# Patient Record
Sex: Female | Born: 1940 | Race: White | Hispanic: No | Marital: Married | Smoking: Never smoker
Health system: Southern US, Community
[De-identification: ages and names within clinical notes are randomized; demographics above are authoritative.]

## PROBLEM LIST (undated history)

## (undated) DIAGNOSIS — I10 Essential (primary) hypertension: Secondary | ICD-10-CM

## (undated) DIAGNOSIS — E7800 Pure hypercholesterolemia, unspecified: Secondary | ICD-10-CM

## (undated) DIAGNOSIS — E119 Type 2 diabetes mellitus without complications: Secondary | ICD-10-CM

## (undated) DIAGNOSIS — K219 Gastro-esophageal reflux disease without esophagitis: Secondary | ICD-10-CM

## (undated) DIAGNOSIS — F329 Major depressive disorder, single episode, unspecified: Secondary | ICD-10-CM

## (undated) DIAGNOSIS — F29 Unspecified psychosis not due to a substance or known physiological condition: Secondary | ICD-10-CM

## (undated) DIAGNOSIS — F32A Depression, unspecified: Secondary | ICD-10-CM

## (undated) DIAGNOSIS — F319 Bipolar disorder, unspecified: Secondary | ICD-10-CM

## (undated) DIAGNOSIS — T50912A Poisoning by multiple unspecified drugs, medicaments and biological substances, intentional self-harm, initial encounter: Secondary | ICD-10-CM

## (undated) HISTORY — PX: NO PAST SURGERIES: SHX2092

---

## 2013-09-02 ENCOUNTER — Encounter (HOSPITAL_BASED_OUTPATIENT_CLINIC_OR_DEPARTMENT_OTHER): Payer: Self-pay | Admitting: Emergency Medicine

## 2013-09-02 ENCOUNTER — Observation Stay (HOSPITAL_BASED_OUTPATIENT_CLINIC_OR_DEPARTMENT_OTHER)
Admission: EM | Admit: 2013-09-02 | Discharge: 2013-09-03 | Disposition: A | Discharge status: Other Acute Inpatient Hospital | Payer: Medicare Other | Attending: Emergency Medicine | Admitting: Emergency Medicine

## 2013-09-02 DIAGNOSIS — E119 Type 2 diabetes mellitus without complications: Secondary | ICD-10-CM | POA: Insufficient documentation

## 2013-09-02 DIAGNOSIS — I10 Essential (primary) hypertension: Secondary | ICD-10-CM | POA: Insufficient documentation

## 2013-09-02 DIAGNOSIS — R45851 Suicidal ideations: Secondary | ICD-10-CM | POA: Insufficient documentation

## 2013-09-02 DIAGNOSIS — K219 Gastro-esophageal reflux disease without esophagitis: Secondary | ICD-10-CM | POA: Insufficient documentation

## 2013-09-02 DIAGNOSIS — E7800 Pure hypercholesterolemia, unspecified: Secondary | ICD-10-CM | POA: Insufficient documentation

## 2013-09-02 DIAGNOSIS — F319 Bipolar disorder, unspecified: Principal | ICD-10-CM | POA: Insufficient documentation

## 2013-09-02 HISTORY — DX: Bipolar disorder, unspecified: F31.9

## 2013-09-02 HISTORY — DX: Major depressive disorder, single episode, unspecified: F32.9

## 2013-09-02 HISTORY — DX: Gastro-esophageal reflux disease without esophagitis: K21.9

## 2013-09-02 HISTORY — DX: Depression, unspecified: F32.A

## 2013-09-02 HISTORY — DX: Poisoning by multiple unspecified drugs, medicaments and biological substances, intentional self-harm, initial encounter: T50.912A

## 2013-09-02 HISTORY — DX: Pure hypercholesterolemia, unspecified: E78.00

## 2013-09-02 HISTORY — DX: Essential (primary) hypertension: I10

## 2013-09-02 HISTORY — DX: Unspecified psychosis not due to a substance or known physiological condition: F29

## 2013-09-02 HISTORY — DX: Type 2 diabetes mellitus without complications: E11.9

## 2013-09-02 NOTE — ED Provider Notes (Signed)
CSN: [id number]     Arrival date & time 09/02/13  2311 History   This chart was scribed for Alexis Kosar Smitty CordsK Dionne Knoop-Rasch, MD by Manuela Schwartzaylor Day, ED scribe. This patient was seen in room MH12/MH12 and the patient's care was started at 2311.  Chief Complaint  Patient presents with  . Psychiatric Evaluation   Patient is a 73 y.o. female presenting with mental health disorder. The history is provided by the patient. The history is limited by the absence of a caregiver (psychotic). No language interpreter was used.  Mental Health Problem Presenting symptoms: agitation, bizarre behavior, delusional, disorganized speech, disorganized thought process and hallucinations   Presenting symptoms: no self mutilation   Degree of incapacity (severity):  Severe Onset quality:  Unable to specify Timing:  Constant Context: not alcohol use   Worsened by:  Nothing tried Ineffective treatments:  None tried Associated symptoms: distractible   Associated symptoms: no chest pain   Risk factors: hx of mental illness    Level 5 caveat to pt's condition  [facility]   HPI Comments: Alexis Thompson is a 73 y.o. female who presents to the Emergency Department complaining of mental health problem. She rambles about psychiatric issues but is unable to organize what is going on or why she is here.  Past Medical History  Diagnosis Date  . Bipolar 1 disorder   . Diabetes mellitus without complication   . Hypertension   . High cholesterol   . GERD (gastroesophageal reflux disease)   . Depression   . Psychosis   . Suicide attempt by multiple drug overdose    History reviewed. No pertinent past surgical history. No family history on file. History  Substance Use Topics  . Smoking status: Never Smoker   . Smokeless tobacco: Not on file  . Alcohol Use: No   OB History   Grav Para Term Preterm Abortions TAB SAB Ect Mult Living                 Review of Systems  Unable to perform  ROS Constitutional: Negative for fever and chills.  Respiratory: Negative for cough and shortness of breath.   Cardiovascular: Negative for chest pain.  Gastrointestinal: Negative for nausea and vomiting.  Musculoskeletal: Negative for back pain.  Skin: Negative for rash.  Psychiatric/Behavioral: Positive for hallucinations, behavioral problems and agitation. Negative for self-injury. The patient is hyperactive.    Allergies  Review of patient's allergies indicates no known allergies.  Home Medications   Current Outpatient Rx  Name  Route  Sig  Dispense  Refill  . buPROPion (WELLBUTRIN XL) 150 MG 24 hr tablet   Oral   Take 150 mg by mouth daily.         . busPIRone (BUSPAR) 5 MG tablet   Oral   Take 5 mg by mouth 2 (two) times daily.         Marland Kitchen. FLUoxetine (PROZAC) 40 MG capsule   Oral   Take 40 mg by mouth daily.         Marland Kitchen. lamoTRIgine (LAMICTAL) 100 MG tablet   Oral   Take 100 mg by mouth daily.         Marland Kitchen. losartan (COZAAR) 100 MG tablet   Oral   Take 100 mg by mouth daily.         . metFORMIN (GLUCOPHAGE) 500 MG tablet   Oral   Take by mouth 2 (two) times daily with a meal.         .  metoprolol tartrate (LOPRESSOR) 25 MG tablet   Oral   Take 12.5 mg by mouth 2 (two) times daily.         Marland Kitchen omeprazole (PRILOSEC) 40 MG capsule   Oral   Take 40 mg by mouth daily.         . simvastatin (ZOCOR) 20 MG tablet   Oral   Take 20 mg by mouth daily.          Triage Vitals: BP 128/108  Pulse 128  Temp(Src) 98.5 F (36.9 C) (Oral)  Resp 22  Ht 5' (1.524 m)  Wt 200 lb (90.719 kg)  BMI 39.06 kg/m2  SpO2 98%  Physical Exam  Nursing note and vitals reviewed. Constitutional: She appears well-developed and well-nourished.  HENT:  Head: Normocephalic and atraumatic.  Mouth/Throat: Oropharynx is clear and moist.  Little crusting at her lips    Eyes: Conjunctivae are normal. Pupils are equal, round, and reactive to light. Right eye exhibits no  discharge. Left eye exhibits no discharge.  Neck: Normal range of motion. No tracheal deviation present.  Cardiovascular: Normal rate, regular rhythm, normal heart sounds and intact distal pulses.   No murmur heard. Pulmonary/Chest: Effort normal and breath sounds normal. No respiratory distress. She has no wheezes. She has no rales.  Abdominal: Soft. Bowel sounds are normal. She exhibits no distension. There is no tenderness. There is no rebound and no guarding.  Musculoskeletal: Normal range of motion. She exhibits no edema.  Neurological: She is alert. She displays normal reflexes.  Skin: Skin is warm and dry. She is not diaphoretic.  Psychiatric: Her speech is rapid and/or pressured and tangential. She is agitated, hyperactive and actively hallucinating. Thought content is delusional. She expresses impulsivity.  Tangential, circumstantial and hallucinating.  She is inattentive.    ED Course  Procedures (including critical care time) DIAGNOSTIC STUDIES: Oxygen Saturation is 98% on room air, normal by my interpretation.    COORDINATION OF CARE: At 1200 AM Discussed treatment plan with patient which includes blood work, UD, ETOH level. Patient agrees.   Labs Review Labs Reviewed  CBC WITH DIFFERENTIAL  COMPREHENSIVE METABOLIC PANEL  URINALYSIS, ROUTINE W REFLEX MICROSCOPIC  URINE RAPID DRUG SCREEN (HOSP PERFORMED)  ETHANOL   Imaging Review No results found.   EKG Interpretation None      MDM   Final diagnoses:  None     Date: 09/03/2013  Rate: 118  Rhythm: sinus tachycardia  QRS Axis: normal  Intervals: normal  ST/T Wave abnormalities: normal  Conduction Disutrbances:none  Narrative Interpretation:   Old EKG Reviewed: none available    Will need TTS when awakes, cleared medically   Resting comfortably at this hour    I personally performed the services described in this documentation, which was scribed in my presence. The recorded information has been  reviewed and is accurate.      Jasmine Awe, MD 09/03/13 [phone]

## 2013-09-02 NOTE — ED Notes (Addendum)
Pt noted to be speaking continually in triage - rambling and not making any sense. Do not know how pt got here at this time.  Pt not able to answer questions reliably. Pt did say "That's why I tell everybody when I get like this I'm gonna kill myself." Pts daughter came room during triage.  She brought pt to ED for threats of suicide.  Sts [facility] and has been "acting like this ever since." Daughter does not know if her meds were changed or not.

## 2013-09-03 ENCOUNTER — Encounter (HOSPITAL_BASED_OUTPATIENT_CLINIC_OR_DEPARTMENT_OTHER): Payer: Self-pay | Admitting: Emergency Medicine

## 2013-09-03 ENCOUNTER — Observation Stay (HOSPITAL_BASED_OUTPATIENT_CLINIC_OR_DEPARTMENT_OTHER): Payer: Medicare Other

## 2013-09-03 LAB — CBC WITH DIFFERENTIAL/PLATELET
Basophils Absolute: 0 10*3/uL (ref 0.0–0.1)
Basophils Relative: 0 % (ref 0–1)
EOS PCT: 0 % (ref 0–5)
Eosinophils Absolute: 0 10*3/uL (ref 0.0–0.7)
HEMATOCRIT: 39.1 % (ref 36.0–46.0)
Hemoglobin: 13 g/dL (ref 12.0–15.0)
LYMPHS ABS: 1.5 10*3/uL (ref 0.7–4.0)
LYMPHS PCT: 13 % (ref 12–46)
MCH: 31 pg (ref 26.0–34.0)
MCHC: 33.2 g/dL (ref 30.0–36.0)
MCV: 93.1 fL (ref 78.0–100.0)
Monocytes Absolute: 0.9 10*3/uL (ref 0.1–1.0)
Monocytes Relative: 8 % (ref 3–12)
NEUTROS ABS: 9.4 10*3/uL — AB (ref 1.7–7.7)
Neutrophils Relative %: 79 % — ABNORMAL HIGH (ref 43–77)
PLATELETS: 261 10*3/uL (ref 150–400)
RBC: 4.2 MIL/uL (ref 3.87–5.11)
RDW: 13.4 % (ref 11.5–15.5)
WBC: 11.9 10*3/uL — AB (ref 4.0–10.5)

## 2013-09-03 LAB — URINALYSIS, ROUTINE W REFLEX MICROSCOPIC
BILIRUBIN URINE: NEGATIVE
Glucose, UA: NEGATIVE mg/dL
Hgb urine dipstick: NEGATIVE
Ketones, ur: NEGATIVE mg/dL
Leukocytes, UA: NEGATIVE
Nitrite: NEGATIVE
Protein, ur: NEGATIVE mg/dL
SPECIFIC GRAVITY, URINE: 1.016 (ref 1.005–1.030)
UROBILINOGEN UA: 0.2 mg/dL (ref 0.0–1.0)
pH: 6 (ref 5.0–8.0)

## 2013-09-03 LAB — RAPID URINE DRUG SCREEN, HOSP PERFORMED
Amphetamines: NOT DETECTED
BARBITURATES: NOT DETECTED
Benzodiazepines: NOT DETECTED
Cocaine: NOT DETECTED
Opiates: NOT DETECTED
TETRAHYDROCANNABINOL: NOT DETECTED

## 2013-09-03 LAB — COMPREHENSIVE METABOLIC PANEL
ALK PHOS: 80 U/L (ref 39–117)
ALT: 19 U/L (ref 0–35)
AST: 24 U/L (ref 0–37)
Albumin: 4.2 g/dL (ref 3.5–5.2)
BUN: 23 mg/dL (ref 6–23)
CALCIUM: 10.7 mg/dL — AB (ref 8.4–10.5)
CO2: 24 meq/L (ref 19–32)
Chloride: 103 mEq/L (ref 96–112)
Creatinine, Ser: 1.6 mg/dL — ABNORMAL HIGH (ref 0.50–1.10)
GFR calc non Af Amer: 32 mL/min — ABNORMAL LOW (ref >90)
GFR, EST AFRICAN AMERICAN: 37 mL/min — AB (ref >90)
GLUCOSE: 158 mg/dL — AB (ref 70–99)
Potassium: 5 mEq/L (ref 3.7–5.3)
Sodium: 143 mEq/L (ref 137–147)
Total Bilirubin: <0.2 mg/dL — ABNORMAL LOW (ref 0.3–1.2)
Total Protein: 7.9 g/dL (ref 6.0–8.3)

## 2013-09-03 LAB — CBG MONITORING, ED: Glucose-Capillary: 98 mg/dL (ref 70–99)

## 2013-09-03 LAB — ETHANOL

## 2013-09-03 MED ORDER — LOSARTAN POTASSIUM 50 MG PO TABS
100.0000 mg | ORAL_TABLET | Freq: Every day | ORAL | Status: DC
  Administered 2013-09-03: 100 mg via ORAL
  Filled 2013-09-03: qty 2

## 2013-09-03 MED ORDER — HALOPERIDOL LACTATE 5 MG/ML IJ SOLN
5.0000 mg | Freq: Once | INTRAMUSCULAR | Status: AC
  Administered 2013-09-03: 5 mg via INTRAMUSCULAR

## 2013-09-03 MED ORDER — ALUM & MAG HYDROXIDE-SIMETH 200-200-20 MG/5ML PO SUSP: 30.0000 mL | ORAL | Status: DC | PRN

## 2013-09-03 MED ORDER — ACETAMINOPHEN 325 MG PO TABS: 650.0000 mg | ORAL_TABLET | ORAL | Status: DC | PRN

## 2013-09-03 MED ORDER — ONDANSETRON HCL 8 MG PO TABS: 4.0000 mg | ORAL_TABLET | Freq: Three times a day (TID) | ORAL | Status: DC | PRN

## 2013-09-03 MED ORDER — HALOPERIDOL LACTATE 5 MG/ML IJ SOLN
5.0000 mg | Freq: Once | INTRAMUSCULAR | Status: AC
  Administered 2013-09-03: 5 mg via INTRAMUSCULAR
  Filled 2013-09-03: qty 1

## 2013-09-03 MED ORDER — FLUOXETINE HCL 40 MG PO CAPS
40.0000 mg | ORAL_CAPSULE | Freq: Every day | ORAL | Status: DC
  Administered 2013-09-03: 40 mg via ORAL

## 2013-09-03 MED ORDER — LORAZEPAM 1 MG PO TABS
1.0000 mg | ORAL_TABLET | Freq: Three times a day (TID) | ORAL | Status: DC | PRN
  Administered 2013-09-03 (×2): 1 mg via ORAL
  Filled 2013-09-03: qty 1

## 2013-09-03 MED ORDER — HALOPERIDOL LACTATE 5 MG/ML IJ SOLN
INTRAMUSCULAR | Status: AC
  Filled 2013-09-03: qty 1

## 2013-09-03 MED ORDER — LORAZEPAM 1 MG PO TABS
ORAL_TABLET | ORAL | Status: AC
  Filled 2013-09-03: qty 1

## 2013-09-03 MED ORDER — LORAZEPAM 2 MG/ML IJ SOLN
1.0000 mg | Freq: Once | INTRAMUSCULAR | Status: AC
  Administered 2013-09-03: 1 mg via INTRAMUSCULAR

## 2013-09-03 MED ORDER — DARIFENACIN HYDROBROMIDE ER 15 MG PO TB24
15.0000 mg | ORAL_TABLET | Freq: Every day | ORAL | Status: DC
  Administered 2013-09-03: 15 mg via ORAL
  Filled 2013-09-03: qty 1

## 2013-09-03 MED ORDER — BUSPIRONE HCL 5 MG PO TABS
5.0000 mg | ORAL_TABLET | Freq: Two times a day (BID) | ORAL | Status: DC
  Administered 2013-09-03: 5 mg via ORAL
  Filled 2013-09-03: qty 1

## 2013-09-03 MED ORDER — METOPROLOL TARTRATE 12.5 MG HALF TABLET
12.5000 mg | ORAL_TABLET | Freq: Two times a day (BID) | ORAL | Status: DC
  Filled 2013-09-03: qty 1

## 2013-09-03 MED ORDER — SIMVASTATIN 20 MG PO TABS
20.0000 mg | ORAL_TABLET | Freq: Every day | ORAL | Status: DC
  Administered 2013-09-03: 20 mg via ORAL
  Filled 2013-09-03: qty 1

## 2013-09-03 MED ORDER — LORAZEPAM 2 MG/ML IJ SOLN
INTRAMUSCULAR | Status: AC
  Filled 2013-09-03: qty 1

## 2013-09-03 MED ORDER — LAMOTRIGINE 100 MG PO TABS
100.0000 mg | ORAL_TABLET | Freq: Every day | ORAL | Status: DC
  Administered 2013-09-03: 100 mg via ORAL
  Filled 2013-09-03: qty 1

## 2013-09-03 MED ORDER — ARIPIPRAZOLE 10 MG PO TABS
10.0000 mg | ORAL_TABLET | Freq: Every day | ORAL | Status: DC
  Administered 2013-09-03: 10 mg via ORAL
  Filled 2013-09-03: qty 1

## 2013-09-03 MED ORDER — LORAZEPAM 2 MG/ML IJ SOLN
1.0000 mg | Freq: Once | INTRAMUSCULAR | Status: AC
  Administered 2013-09-03: 1 mg via INTRAMUSCULAR
  Filled 2013-09-03: qty 1

## 2013-09-03 MED ORDER — PANTOPRAZOLE SODIUM 40 MG PO TBEC
40.0000 mg | DELAYED_RELEASE_TABLET | Freq: Every day | ORAL | Status: DC
  Administered 2013-09-03: 40 mg via ORAL

## 2013-09-03 MED ORDER — BUPROPION HCL ER (XL) 150 MG PO TB24
150.0000 mg | ORAL_TABLET | Freq: Every day | ORAL | Status: DC
  Administered 2013-09-03: 150 mg via ORAL
  Filled 2013-09-03: qty 1

## 2013-09-03 MED ORDER — HALOPERIDOL LACTATE 5 MG/ML IJ SOLN
2.0000 mg | Freq: Once | INTRAMUSCULAR | Status: AC
  Administered 2013-09-03: 2 mg via INTRAVENOUS
  Filled 2013-09-03: qty 1

## 2013-09-03 MED ORDER — METFORMIN HCL 500 MG PO TABS
500.0000 mg | ORAL_TABLET | Freq: Two times a day (BID) | ORAL | Status: DC
  Administered 2013-09-03: 500 mg via ORAL

## 2013-09-03 NOTE — ED Notes (Signed)
Ava, from TTS states she will call us back with an apt. For pt to speak with mental health professional for assessment.

## 2013-09-03 NOTE — ED Notes (Signed)
Call placed to [facility] for pt transport to [facility].

## 2013-09-03 NOTE — ED Notes (Signed)
Ava, TTS, calls back to inform this rn that pt meets inpatient placement criteria. Ava states she plans to call [facility] for placement, as there [facility]. Daughter updated on plan.

## 2013-09-03 NOTE — ED Notes (Signed)
tct jennifer with [facility], pt has been accepted via dr. Lowanda FosterBeverly jones
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
## 2013-09-03 NOTE — ED Notes (Signed)
Pt daughter calls this rn to room, states pt "is starting to hallucinate again". Pt is agitated, talking with daughter and sitter at bedside. Ativan given as ordered.

## 2013-09-03 NOTE — ED Notes (Signed)
Jennifer at [facility] notified of patients discharge, per jennifer pt is going to room 424 A

## 2013-09-03 NOTE — ED Notes (Signed)
Voluntary admission form rcvd, pt signed form, was able to verbally state reason for transfer to [facility]. Verbally stated to this RN that she wanted to go and get "medications adjusted". Sitter at bedside with patient. Pt alert and oriented to person, place, time and situation. Requested sleep medications

## 2013-09-03 NOTE — BH Assessment (Signed)
Incoming staff will [facility].

## 2013-09-03 NOTE — ED Notes (Signed)
Pt husband has brought pt home medications. Pharmacy alerted, will come to ed and assist this rn with placing them into our pyxis.

## 2013-09-03 NOTE — ED Notes (Signed)
Voluntary admission form to be faxed to [phone] per jennifer at [facility], fax number verified for 2nd time, form to be faxed again to this facility.

## 2013-09-03 NOTE — ED Notes (Signed)
[facility] not received from [facility], spoke with jennifer who is re faxing form

## 2013-09-03 NOTE — ED Notes (Signed)
t[facility]is [facility] at [facility], requesting that patient's RN call and speak with assesment RN at [facility] to complete requested information they need so final decision on acceptance can be made. Attempted to call assesment RN at [facility] [phone], no answer and no voicemail available, will attempt again

## 2013-09-03 NOTE — ED Notes (Signed)
Pt and daughter given juice and snacks per request.

## 2013-09-03 NOTE — ED Notes (Signed)
Form faxed back to jennifer at [facility], tx successful confirmation placed with patients chart. Pt provided ativan per request for nerves.

## 2013-09-03 NOTE — ED Notes (Signed)
Pt continuing to talk to staff and calling people various names.  having various scattered thought processes.  Daughter remains at bedside.  Pt appears to be in a maniac state.  Calm and then starts yelling. Unable to remain still.  Pt mumbling and talking entire time.

## 2013-09-03 NOTE — ED Notes (Signed)
rec'd call from [place]ennifer at [facility] stating they had accepted pt and would be faxing over voluntary consent form which needed to be faxed back and then bed assignment would be given. Pt accepted by Dr Low[facility] FosterBeverly Jones at [facility].

## 2013-09-03 NOTE — ED Notes (Signed)
tct [facility] at [phone], spoke with jennifer assesment nurse, informed that patient remains alert, oriented to person place and time. Pt is able to sign voluntary papers if needed

## 2013-09-03 NOTE — ED Notes (Signed)
Pt is now awake and alert, smiling and talking to daughter and sitter at bedside. Pt denies any c/o, when asked if she feels ready to talk to the doctors, states "Yes, honey, I'm ready."

## 2013-09-03 NOTE — ED Notes (Signed)
Report received, pt care assumed. Pt is sleeping, plan of care is to call [facility] when pt awakens for assessment. Pt daughter is asleep in family conference room.

## 2013-09-03 NOTE — ED Notes (Signed)
Psych triage phoned, ava states that they are still seeking placement for pt in a geriatric psych unit, and if no bed found they may seek [facility] in [facility] psych at [facility]. Pt family updated on plan of care.

## 2013-09-03 NOTE — ED Notes (Signed)
Pt and daughter in room, tts assessment in progress.

## 2013-09-03 NOTE — BH Assessment (Signed)
[facility] Assessment Progress Note        Per, nurse Diane the patient is not able to wake up to be assessed because she was given medication due to her increased mania.    At12:15am the patient was given 5mg  of Haldol and 1mg  of Ativan.   At 1:00am the patient was given 5mg  of Haldol and 1mg  of Ativan.  At 2:07am the patient was given 2mg  of Haldol.    Writer informed the ER MD (Dr. April Palumbo).  The nurse will call the assessment office once the patient is alert and able to be assessed.

## 2013-09-03 NOTE — ED Notes (Signed)
Pt home medications taken to outpatient pharmacy for verification. Two different capsules noted inside prozac bottle. Pharmacist to verfiy all home meds and call this rn when verification is complete.

## 2013-09-03 NOTE — ED Notes (Signed)
Pt discharged with [facility] transporter, ambulated to vehicle, nad, alert and oriented

## 2013-09-03 NOTE — ED Notes (Signed)
Pt's  Daughter and POA debbie returned call and stated that she would be unable to get medications until tomorrow afternoon. Informed her that medications would be left in our locked pyxis, draw 6 pkt 10 and to ask for charge RN and she can obtain them

## 2013-09-03 NOTE — ED Notes (Signed)
Pt cont sleeping with resp deep and regular. Sitter at bedside, given juice and snacks per request.

## 2013-09-03 NOTE — Progress Notes (Signed)
The patient has been referred to [facility], [facility], [facility] and [facility].

## 2013-09-03 NOTE — ED Notes (Signed)
Family at bedside. 

## 2015-04-27 ENCOUNTER — Non-Acute Institutional Stay (SKILLED_NURSING_FACILITY): Payer: Medicare Other | Admitting: Adult Health

## 2015-04-27 ENCOUNTER — Encounter: Payer: Self-pay | Admitting: Adult Health

## 2015-04-27 DIAGNOSIS — K219 Gastro-esophageal reflux disease without esophagitis: Secondary | ICD-10-CM | POA: Diagnosis not present

## 2015-04-27 DIAGNOSIS — I10 Essential (primary) hypertension: Secondary | ICD-10-CM | POA: Diagnosis not present

## 2015-04-27 DIAGNOSIS — G2571 Drug induced akathisia: Secondary | ICD-10-CM | POA: Diagnosis not present

## 2015-04-27 DIAGNOSIS — M79641 Pain in right hand: Secondary | ICD-10-CM

## 2015-04-27 DIAGNOSIS — F319 Bipolar disorder, unspecified: Secondary | ICD-10-CM | POA: Diagnosis not present

## 2015-04-27 DIAGNOSIS — N3281 Overactive bladder: Secondary | ICD-10-CM

## 2015-04-27 DIAGNOSIS — G629 Polyneuropathy, unspecified: Secondary | ICD-10-CM | POA: Insufficient documentation

## 2015-04-27 DIAGNOSIS — R296 Repeated falls: Secondary | ICD-10-CM

## 2015-04-27 DIAGNOSIS — E785 Hyperlipidemia, unspecified: Secondary | ICD-10-CM | POA: Diagnosis not present

## 2015-04-27 DIAGNOSIS — G4700 Insomnia, unspecified: Secondary | ICD-10-CM | POA: Diagnosis not present

## 2015-04-27 DIAGNOSIS — F419 Anxiety disorder, unspecified: Secondary | ICD-10-CM | POA: Insufficient documentation

## 2015-04-27 NOTE — Progress Notes (Signed)
Patient ID: Alexis Thompson, female   DOB: 10-Aug-1940, 74 y.o.   MRN: [id number]    DATE:  04/27/2015   MRN:  [id number]  BIRTHDAY: Dec 22, 1940  Facility:  Nursing Home Location:  [facility]  Nursing Home Room Number: 801-2  LEVEL OF CARE:  SNF [phone])  Contact Information    Name Relation Home Work Frisbee Spouse [phone]  [phone]   Parker,Tracy Daughter [phone]  [phone]       Chief Complaint  Patient presents with  . Hospitalization Follow-up    Recurrent falls, right hand wrist/pain, diabetes mellitus, bipolar disorder, drug induced akathisia, hypertension, anxiety, hyperlipidemia, neuropathy, insomnia, GERD and overactive bladder.    HISTORY OF PRESENT ILLNESS:  This is a 74 year old female who has been admitted to [facility] on 04/23/15 from [facility]. She lives in an ALF. She fell while getting out of her bed. She has been having recurrent falls and complicated by akathisia. She is noted to have a wrist/hand splint. Area is very tender but able to move fingers.  PAST MEDICAL HISTORY:  Past Medical History  Diagnosis Date  . Bipolar 1 disorder (HCC)   . Diabetes mellitus without complication (HCC)   . Hypertension   . High cholesterol   . GERD (gastroesophageal reflux disease)   . Depression   . Psychosis   . Suicide attempt by multiple drug overdose [facility])      CURRENT MEDICATIONS: Reviewed    Medication List       This list is accurate as of: 04/27/15  5:19 PM.  Always use your most recent med list.               amantadine 100 MG capsule  Commonly known as:  SYMMETREL  Take 100 mg by mouth 2 (two) times daily.     aspirin EC 81 MG tablet  Take 81 mg by mouth daily.     buPROPion 300 MG 24 hr tablet  Commonly known as:  WELLBUTRIN XL  Take 300 mg by mouth daily.     clorazepate 3.75 MG tablet  Commonly known as:  TRANXENE  Take 3.75 mg by mouth 2 (two) times daily.     FLUoxetine 20 MG capsule  Commonly known as:  PROZAC  Take 20 mg by mouth daily.     gabapentin 100 MG capsule  Commonly known as:  NEURONTIN  Take 100 mg by mouth at bedtime.     HYDROcodone-acetaminophen 5-325 MG tablet  Commonly known as:  NORCO/VICODIN  Take 1 tablet by mouth every 6 (six) hours as needed for moderate pain.     lamoTRIgine 200 MG tablet  Commonly known as:  LAMICTAL  Take 200 mg by mouth daily.     losartan 25 MG tablet  Commonly known as:  COZAAR  Take 25 mg by mouth daily.     Melatonin 5 MG Tabs  Take 5 mg by mouth at bedtime.     metFORMIN 500 MG 24 hr tablet  Commonly known as:  GLUCOPHAGE-XR  Take 500 mg by mouth 2 (two) times daily.     metoprolol tartrate 25 MG tablet  Commonly known as:  LOPRESSOR  Take 12.5 mg by mouth 2 (two) times daily.     nystatin 100000 UNIT/GM Powd  Apply topically 2 (two) times daily as needed.     omeprazole 40 MG capsule  Commonly known as:  PRILOSEC  Take 40 mg by mouth daily.  oxybutynin 10 MG 24 hr tablet  Commonly known as:  DITROPAN-XL  Take 10 mg by mouth every morning.     simvastatin 20 MG tablet  Commonly known as:  ZOCOR  Take 20 mg by mouth daily.     traMADol 50 MG tablet  Commonly known as:  ULTRAM  Take by mouth every 6 (six) hours as needed.           No Known Allergies   REVIEW OF SYSTEMS:  GENERAL: no change in appetite, no fatigue, no weight changes, no fever, chills or weakness EYES: Denies change in vision, dry eyes, eye pain, itching or discharge EARS: Denies change in hearing, ringing in ears, or earache NOSE: Denies nasal congestion or epistaxis MOUTH and THROAT: Denies oral discomfort, gingival pain or bleeding, pain from teeth or hoarseness   RESPIRATORY: no cough, SOB, DOE, wheezing, hemoptysis CARDIAC: no chest pain, edema or palpitations GI: no abdominal pain, diarrhea, constipation, heart burn, nausea or vomiting GU: Denies dysuria, frequency, hematuria,  incontinence, or discharge PSYCHIATRIC: Denies feeling of depression or anxiety. No report of hallucinations, insomnia, paranoia, or agitation   PHYSICAL EXAMINATION  GENERAL APPEARANCE: Well nourished. In no acute distress. Normal body habitus SKIN:  Right forearm with skin tear, no erythema  HEAD: Normal in size and contour. No evidence of trauma EYES: Lids open and close normally. No blepharitis, entropion or ectropion. PERRL. Conjunctivae are clear and sclerae are white. Lenses are without opacity EARS: Pinnae are normal. Patient hears normal voice tunes of the examiner MOUTH and THROAT: Lips are without lesions. Oral mucosa is moist and without lesions. Tongue is normal in shape, size, and color and without lesions NECK: supple, trachea midline, no neck masses, no thyroid tenderness, no thyromegaly LYMPHATICS: no LAN in the neck, no supraclavicular LAN RESPIRATORY: breathing is even & unlabored, BS CTAB CARDIAC: RRR, no murmur,no extra heart sounds, no edema GI: abdomen soft, normal BS, no masses, no tenderness, no hepatomegaly, no splenomegaly EXTREMITIES:  Able to move 4 extremities PSYCHIATRIC: Alert and oriented X 3. Affect and behavior are appropriate  LABS/RADIOLOGY: Labs reviewed: 04/21/15  WBC 6.4 hemoglobin 12.3 hematocrit 36.4 platelet 206 sodium 140 potassium 3.9 BUN 14 creatinine 1.01 glucose 106 calcium 9.2  04/20/15  sodium 143 potassium 4.2 glucose 101 BUN 17 creatinine 1.11 calcium 9.4 albumin 3.8 total protein 6.8 total bilirubin 0.4 AST 10 ALT 7 alkaline phosphatase 66 WBC 4.8 hemoglobin 13.2 hematocrit 39.8 platelet 239   ASSESSMENT/PLAN:  Recurrent falls - for rehabilitation  Right hand/wrist pain - continue tramadol 50 mg 1 tab by mouth every 6 hours when necessary and Norco 5/325 mg 1 tab by mouth every 6 hours when necessary for pain; x-ray of right hand/wrist; orthopedic [facility] Begovitch[facility], on 05/04/15 at 2  PM  Diabetes mellitus, type II - continue metformin 500 mg 1 tab by mouth twice a day; check hemoglobin A1c  Bipolar disorder -lead to do was discontinued due to side effects; continue Lamictal 200 mg 1 tab by mouth every morning, Prozac 40 mg by mouth every morning and Wellbutrin 24-hour 300 mg 1 tab by mouth every morning  Drug-induced akathisia - reportedly caused by JordanLatuda which is now discontinued; continue Amantadine Hcl 100 mg 1 capsule PO BID; neurology consult ; check CBC  Hypertension - continue Cozaar 25 mg 1 tab by mouth every morning and Toprol-XL 25 mg 1/2 tab = 12.5 mg by mouth twice a day; check BMP  Anxiety - mood is stable; continue Tranxene 3.75 mg by mouth twice a day  Hyperlipidemia - continue Zocor 20 mg 1 tab by mouth daily at bedtime  Neuropathy - continue Neurontin 100 mg 1 capsule by mouth daily at bedtime  Insomnia - continue melatonin 5 mg 1 tab by mouth daily at bedtime  GERD - continue Prilosec 40 mg by mouth every morning  Overactive bladder - continue Ditropan XL 10 mg 1 tab by mouth every morning     Goals of care:  Short-term rehabilitation    [facility], NP [facility] [phone]

## 2015-04-28 ENCOUNTER — Non-Acute Institutional Stay (SKILLED_NURSING_FACILITY): Payer: Medicare Other | Admitting: Internal Medicine

## 2015-04-28 DIAGNOSIS — M79631 Pain in right forearm: Secondary | ICD-10-CM

## 2015-04-28 DIAGNOSIS — N183 Chronic kidney disease, stage 3 unspecified: Secondary | ICD-10-CM

## 2015-04-28 DIAGNOSIS — S40021S Contusion of right upper arm, sequela: Secondary | ICD-10-CM | POA: Diagnosis not present

## 2015-04-28 DIAGNOSIS — I10 Essential (primary) hypertension: Secondary | ICD-10-CM | POA: Diagnosis not present

## 2015-04-28 DIAGNOSIS — R300 Dysuria: Secondary | ICD-10-CM | POA: Diagnosis not present

## 2015-04-28 DIAGNOSIS — R1310 Dysphagia, unspecified: Secondary | ICD-10-CM

## 2015-04-28 DIAGNOSIS — K219 Gastro-esophageal reflux disease without esophagitis: Secondary | ICD-10-CM | POA: Diagnosis not present

## 2015-04-28 DIAGNOSIS — F319 Bipolar disorder, unspecified: Secondary | ICD-10-CM

## 2015-04-28 DIAGNOSIS — F411 Generalized anxiety disorder: Secondary | ICD-10-CM

## 2015-04-28 DIAGNOSIS — E1149 Type 2 diabetes mellitus with other diabetic neurological complication: Secondary | ICD-10-CM

## 2015-04-28 DIAGNOSIS — G629 Polyneuropathy, unspecified: Secondary | ICD-10-CM

## 2015-04-28 DIAGNOSIS — N3281 Overactive bladder: Secondary | ICD-10-CM

## 2015-04-28 DIAGNOSIS — R2681 Unsteadiness on feet: Secondary | ICD-10-CM

## 2015-04-28 DIAGNOSIS — R450 Nervousness: Secondary | ICD-10-CM | POA: Diagnosis not present

## 2015-04-28 DIAGNOSIS — E785 Hyperlipidemia, unspecified: Secondary | ICD-10-CM

## 2015-04-28 DIAGNOSIS — G2571 Drug induced akathisia: Secondary | ICD-10-CM

## 2015-04-28 NOTE — Progress Notes (Signed)
Patient ID: Alexis Thompson, female   DOB: 1940-08-14, 74 y.o.   MRN: [id number]      [facility]   PCP: No PCP Per Patient  Code Status: full code  No Known Allergies  Chief Complaint  Patient presents with  . New Admit To SNF     HPI:  74 y.o. patient is here for short term rehabilitation post hospital admission from 04/21/15-04/23/15 post fall with akathisia. Fracture was ruled out. She was residing in ALF prior to this. She has PMH of HTN, GERD, OAB, bipolar disorder, HLD among others. She is seen in her room today. She is now off latuda and her tremor has improved some per patient. She complaints of pain in her right forearm. She has been very anxious per staff. No falls reported in the facility.   Review of Systems:  Constitutional: Negative for fever, chills, diaphoresis.  HENT: Negative for headache, congestion, nasal discharge. Has difficulty swallowing and occasional occipital headache, none at present.  Eyes: Negative for eye pain, blurred vision, double vision and discharge.  Respiratory: positive for dyspnea on exertion. Negative for cough and wheezing.   Cardiovascular: Negative for chest pain, palpitations, leg swelling.  Gastrointestinal: Negative for heartburn, nausea, vomiting, abdominal pain. Had bowel movement yesterday. Genitourinary: positive for dysuria and increased urinary frequency. Negative for flank pain.  Musculoskeletal: Negative for back pain, falls in the facility. Skin: Negative for itching, rash.  Neurological: Negative for dizziness, tingling, focal weakness Psychiatric/Behavioral: positive for anxiety    Past Medical History  Diagnosis Date  . Bipolar 1 disorder (HCC)   . Diabetes mellitus without complication (HCC)   . Hypertension   . High cholesterol   . GERD (gastroesophageal reflux disease)   . Depression   . Psychosis   . Suicide attempt by multiple drug overdose (HCC)    No past surgical history on  file. Social History:   reports that she has never smoked. She does not have any smokeless tobacco history on file. She reports that she does not drink alcohol or use illicit drugs.  No family history on file.  Medications:   Medication List       This list is accurate as of: 04/28/15  3:30 PM.  Always use your most recent med list.               amantadine 100 MG capsule  Commonly known as:  SYMMETREL  Take 100 mg by mouth 2 (two) times daily.     aspirin EC 81 MG tablet  Take 81 mg by mouth daily.     buPROPion 300 MG 24 hr tablet  Commonly known as:  WELLBUTRIN XL  Take 300 mg by mouth daily.     clorazepate 3.75 MG tablet  Commonly known as:  TRANXENE  Take 3.75 mg by mouth 2 (two) times daily.     FLUoxetine 20 MG capsule  Commonly known as:  PROZAC  Take 20 mg by mouth daily.     gabapentin 100 MG capsule  Commonly known as:  NEURONTIN  Take 100 mg by mouth at bedtime.     HYDROcodone-acetaminophen 5-325 MG tablet  Commonly known as:  NORCO/VICODIN  Take 1 tablet by mouth every 6 (six) hours as needed for moderate pain.     lamoTRIgine 200 MG tablet  Commonly known as:  LAMICTAL  Take 200 mg by mouth daily.     losartan 25 MG tablet  Commonly known as:  COZAAR  Take 25 mg by mouth daily.     Melatonin 5 MG Tabs  Take 5 mg by mouth at bedtime.     metFORMIN 500 MG 24 hr tablet  Commonly known as:  GLUCOPHAGE-XR  Take 500 mg by mouth 2 (two) times daily.     metoprolol tartrate 25 MG tablet  Commonly known as:  LOPRESSOR  Take 12.5 mg by mouth 2 (two) times daily.     nystatin 100000 UNIT/GM Powd  Apply topically 2 (two) times daily as needed.     omeprazole 40 MG capsule  Commonly known as:  PRILOSEC  Take 40 mg by mouth daily.     oxybutynin 10 MG 24 hr tablet  Commonly known as:  DITROPAN-XL  Take 10 mg by mouth every morning.     simvastatin 20 MG tablet  Commonly known as:  ZOCOR  Take 20 mg by mouth daily.     traMADol 50 MG  tablet  Commonly known as:  ULTRAM  Take by mouth every 6 (six) hours as needed.         Physical Exam: Filed Vitals:   04/28/15 1528  BP: 109/71  Pulse: 61  Temp: 97.9 F (36.6 C)  Resp: 18  SpO2: 96%    General- elderly female, obese, in no acute distress Head- normocephalic, atraumatic Nose- normal nasal mucosa, no maxillary or frontal sinus tenderness, no nasal discharge Throat- moist mucus membrane Eyes- PERRLA, EOMI, no pallor, no icterus, no discharge, normal conjunctiva, normal sclera Neck- no cervical lymphadenopathy Cardiovascular- normal s1,s2, no murmurs, no leg edema Respiratory- bilateral clear to auscultation, no wheeze, no rhonchi, no crackles, no use of accessory muscles Abdomen- bowel sounds present, soft, non tender Musculoskeletal- able to move all 4 extremities, generalized weakness, unsteady gait Neurological- Lolita Riegerakathasia present, alert and oriented to person and place Skin- warm and dry, easy bruising, hematoma in right forearm tender to touch, no drainage Psychiatry- normal mood and affect  Labs reviewed 04/27/15 wbc 5.2, hb 12.4, hct 38.7, plt 230, a1c 5.8, na 144, k 4.1, glu 102, bun 24, cr 1.3, gfr 42.81, ca 10   Assessment/Plan  Unsteady gait Causing Recurrent falls. Will have patient work with PT/OT as tolerated to regain strength and restore function.  Fall precautions are in place.  Akathisia Thought to be drug induced. Continue amantadine 100 mg bid for now. Pending neurology consult  Right forearm hematoma Will start her on naprosyn 250 mg bid for now x 3 days with meals to help with inflammation, will need padded dressing over the hematoma with kerlix wrap to help prevent skin breakdown. Monitor clinically. No signs of infection at present  Right hand/wrist pain  Post fall, negative for acute fracture. continue tramadol and change this to 1-2 tab q6h prn pain and discontinue norco for now. Has orthopedic appointment on  05/04/15.  Dysuria Send u/a with c/s for now, hydration encouraged, afebrile, monitor  Dysphagia Tolerating liquid food fine but has trouble occasionally with solid food. Get SLP consult  HTN BP reading on lower side of normal. Discontinue cozaar for now with low bp and impaired renal function. Continue toprol xl 12. 5 mg bid with holding parameters and check bp daily x 1 week.  BP Readings from Last 3 Encounters:  04/28/15 109/71  04/27/15 136/80  09/03/13 98/57    Diabetes mellitus 2 with neuropathy Monitor cbg, reviewed a1c. continue metformin 500 mg bid for now with statin and aspirin. Continue neurontin and fall precautions for now  ckd stage 3 D/c cozaar. Starting naproxen to help with inflammation x 5 days, check bmp in 1 week  Anxiety On clorazepate 3.75 mg bid. Change this to 7.5 mg in am and 3.75 mg in pm for now and reassess  Bipolar disorder Stable. continue Lamictal 200 mg daily, Prozac 40 mg daily and wellbutrin 300 mg daily for now and monitor.   Neuropathy Continue neurontin 100 mg qhs and monitor  OAB Continue ditropan 10 mg daily  Hyperlipidemia continue Zocor 20 mg daily  GERD continue Prilosec 40 mg daily   Goals of care: short term rehabilitation   Labs/tests ordered:  Family/ staff Communication: reviewed care plan with patient and nursing supervisor    Oneal Grout, MD  [facility] [phone] (Monday-Friday 8 am - 5 pm) [phone] (afterhours)

## 2015-05-06 ENCOUNTER — Non-Acute Institutional Stay (SKILLED_NURSING_FACILITY): Payer: Medicare Other | Admitting: Adult Health

## 2015-05-06 DIAGNOSIS — N179 Acute kidney failure, unspecified: Secondary | ICD-10-CM

## 2015-05-06 DIAGNOSIS — E1129 Type 2 diabetes mellitus with other diabetic kidney complication: Secondary | ICD-10-CM | POA: Diagnosis not present

## 2015-05-08 ENCOUNTER — Encounter: Payer: Self-pay | Admitting: Adult Health

## 2015-05-08 NOTE — Progress Notes (Signed)
Patient ID: Alexis Thompson, female   DOB: [birth year], 74 y.o.   MRN: [id number]    DATE:  05/06/15  MRN:  [id number]  BIRTHDAY: [birth year]  Facility:  Nursing Home [facility]  Nursing Home Room Number: [id number]  LEVEL OF CARE:  SNF [phone])      Contact Information    Name Relation Home Work KimbertonMobile   Hamelin,David Spouse [phone]  [phone]   Parker,Tracy Daughter [phone]  [phone]       Chief Complaint  Patient presents with  . Acute Visit    Acute renal failure    HISTORY OF PRESENT ILLNESS:  This is a 74 year old female who has been noted to have creatinine 2.25, BUN 52. She is currently taking Metformin for diabetes mellitus. She has tremors which she has since [facility].   [facility]. She lives in an ALF. She fell while getting out of her bed. She has been having recurrent falls and complicated by akathisia.    PAST MEDICAL HISTORY:  Past Medical History  Diagnosis Date  . Bipolar 1 disorder (HCC)   . Diabetes mellitus without complication (HCC)   . Hypertension   . High cholesterol   . GERD (gastroesophageal reflux disease)   . Depression   . Psychosis   . Suicide attempt by multiple drug overdose [facility](HCC)      CURRENT MEDICATIONS: Reviewed    Medication List       This list is accurate as of: 05/06/15 11:59 PM.  Always use your most recent med list.               amantadine 100 MG capsule  Commonly known as:  SYMMETREL  Take 100 mg by mouth 2 (two) times daily.     aspirin EC 81 MG tablet  Take 81 mg by mouth daily.     buPROPion 300 MG 24 hr tablet  Commonly known as:  WELLBUTRIN XL  Take 300 mg by mouth daily.     clorazepate 3.75 MG tablet  Commonly known as:  TRANXENE  Take 3.75 mg by mouth 2 (two) times daily.     FLUoxetine 20 MG capsule  Commonly known as:  PROZAC  Take 20 mg by mouth daily.     gabapentin 100 MG capsule  Commonly known as:  NEURONTIN  Take 100 mg by mouth at bedtime.     lamoTRIgine 200 MG tablet  Commonly known as:  LAMICTAL  Take 200 mg by mouth daily.     Melatonin 5 MG Tabs  Take 5 mg by mouth at bedtime.     metoprolol tartrate 25 MG tablet  Commonly known as:  LOPRESSOR  Take 12.5 mg by mouth 2 (two) times daily. Hold for SBP < 110 and HR <60     nystatin 100000 UNIT/GM Powd  Apply topically 2 (two) times daily as needed.     omeprazole 40 MG capsule  Commonly known as:  PRILOSEC  Take 40 mg by mouth daily.     oxybutynin 10 MG 24 hr tablet  Commonly known as:  DITROPAN-XL  Take 10 mg by mouth every morning.     simvastatin 20 MG tablet  Commonly known as:  ZOCOR  Take 20 mg by mouth daily.     traMADol 50 MG tablet  Commonly known as:  ULTRAM  Take by mouth every 6 (six) hours  as needed. 1-2 tabs PO Q 6 hours PRN         No Known Allergies   REVIEW OF SYSTEMS:  GENERAL: no change in appetite, no fatigue, no weight changes, no fever, chills or weakness EYES: Denies change in vision, dry eyes, eye pain, itching or discharge EARS: Denies change in hearing, ringing in ears, or earache NOSE: Denies nasal congestion or epistaxis MOUTH and THROAT: Denies oral discomfort, gingival pain or bleeding, pain from teeth or hoarseness   RESPIRATORY: no cough, SOB, DOE, wheezing, hemoptysis CARDIAC: no chest pain, edema or palpitations GI: no abdominal pain, diarrhea, constipation, heart burn, nausea or vomiting GU: Denies dysuria, frequency, hematuria, incontinence, or discharge PSYCHIATRIC: Denies feeling of depression or anxiety. No report of hallucinations, insomnia, paranoia, or agitation   PHYSICAL EXAMINATION  GENERAL APPEARANCE: Well nourished. In no acute distress. Obese SKIN:  Right forearm hematoma  HEAD: Normal in size and contour. No evidence of trauma EYES: Lids open and close normally. No blepharitis, entropion or ectropion.  PERRL. Conjunctivae are clear and sclerae are white. Lenses are without opacity EARS: Pinnae are normal. Patient hears normal voice tunes of the examiner MOUTH and THROAT: Lips are without lesions. Oral mucosa is moist and without lesions. Tongue is normal in shape, size, and color and without lesions NECK: supple, trachea midline, no neck masses, no thyroid tenderness, no thyromegaly LYMPHATICS: no LAN in the neck, no supraclavicular LAN RESPIRATORY: breathing is even & unlabored, BS CTAB CARDIAC: RRR, no murmur,no extra heart sounds, no edema GI: abdomen soft, normal BS, no masses, no tenderness, no hepatomegaly, no splenomegaly EXTREMITIES:  Able to move 4 extremities PSYCHIATRIC: Alert and oriented X 3. Affect and behavior are appropriate  LABS/RADIOLOGY: Labs reviewed: 05/05/15  sodium 140 potassium 4.6 glucose 108 BUN 52 creatinine 2.25 calcium 10.8 GFR 22.73 04/24/15  x-ray of left wrist and hand is negative for fracture 04/21/15  WBC 6.4 hemoglobin 12.3 hematocrit 36.4 platelet 206 sodium 140 potassium 3.9 BUN 14 creatinine 1.01 glucose 106 calcium 9.2  04/20/15  sodium 143 potassium 4.2 glucose 101 BUN 17 creatinine 1.11 calcium 9.4 albumin 3.8 total protein 6.8 total bilirubin 0.4 AST 10 ALT 7 alkaline phosphatase 66 WBC 4.8 hemoglobin 13.2 hematocrit 39.8 platelet 239   ASSESSMENT/PLAN:  Acute renal failure - discontinue metformin; start 1/2 NS @ 70cc/hour X 1 L; check BMP on 05/11/15; insert foley catheter and check post-void residual  Diabetes Mellitus, type 2 - discontinue Metformin; continue CBG check daily and call provider for CBG > 300     MEDINA-VARGAS,Sarya Linenberger, NP [facility] [phone]

## 2015-05-12 ENCOUNTER — Emergency Department (HOSPITAL_COMMUNITY)
Admission: EM | Admit: 2015-05-12 | Discharge: 2015-05-12 | Disposition: A | Discharge status: Home / Self Care | Payer: Medicare Other | Attending: Emergency Medicine | Admitting: Emergency Medicine

## 2015-05-12 ENCOUNTER — Encounter (HOSPITAL_COMMUNITY): Payer: Self-pay | Admitting: Emergency Medicine

## 2015-05-12 ENCOUNTER — Emergency Department (HOSPITAL_COMMUNITY): Payer: Medicare Other

## 2015-05-12 DIAGNOSIS — I10 Essential (primary) hypertension: Secondary | ICD-10-CM | POA: Insufficient documentation

## 2015-05-12 DIAGNOSIS — E119 Type 2 diabetes mellitus without complications: Secondary | ICD-10-CM | POA: Insufficient documentation

## 2015-05-12 DIAGNOSIS — Z7982 Long term (current) use of aspirin: Secondary | ICD-10-CM | POA: Diagnosis not present

## 2015-05-12 DIAGNOSIS — R441 Visual hallucinations: Secondary | ICD-10-CM | POA: Diagnosis not present

## 2015-05-12 DIAGNOSIS — Z79899 Other long term (current) drug therapy: Secondary | ICD-10-CM | POA: Insufficient documentation

## 2015-05-12 DIAGNOSIS — F319 Bipolar disorder, unspecified: Secondary | ICD-10-CM | POA: Diagnosis not present

## 2015-05-12 DIAGNOSIS — F0390 Unspecified dementia without behavioral disturbance: Secondary | ICD-10-CM | POA: Insufficient documentation

## 2015-05-12 DIAGNOSIS — K219 Gastro-esophageal reflux disease without esophagitis: Secondary | ICD-10-CM | POA: Diagnosis not present

## 2015-05-12 DIAGNOSIS — E7800 Pure hypercholesterolemia, unspecified: Secondary | ICD-10-CM | POA: Insufficient documentation

## 2015-05-12 DIAGNOSIS — J029 Acute pharyngitis, unspecified: Secondary | ICD-10-CM | POA: Diagnosis not present

## 2015-05-12 DIAGNOSIS — R509 Fever, unspecified: Secondary | ICD-10-CM | POA: Diagnosis present

## 2015-05-12 DIAGNOSIS — N390 Urinary tract infection, site not specified: Secondary | ICD-10-CM

## 2015-05-12 DIAGNOSIS — R443 Hallucinations, unspecified: Secondary | ICD-10-CM

## 2015-05-12 LAB — URINALYSIS, ROUTINE W REFLEX MICROSCOPIC
BILIRUBIN URINE: NEGATIVE
GLUCOSE, UA: NEGATIVE mg/dL
KETONES UR: 15 mg/dL — AB
NITRITE: POSITIVE — AB
PROTEIN: 100 mg/dL — AB
Specific Gravity, Urine: 1.025 (ref 1.005–1.030)
pH: 5.5 (ref 5.0–8.0)

## 2015-05-12 LAB — COMPREHENSIVE METABOLIC PANEL
ALK PHOS: 65 U/L (ref 38–126)
ALT: 19 U/L (ref 14–54)
ANION GAP: 11 (ref 5–15)
AST: 21 U/L (ref 15–41)
Albumin: 4.4 g/dL (ref 3.5–5.0)
BUN: 58 mg/dL — ABNORMAL HIGH (ref 6–20)
CHLORIDE: 106 mmol/L (ref 101–111)
CO2: 20 mmol/L — AB (ref 22–32)
CREATININE: 1.81 mg/dL — AB (ref 0.44–1.00)
Calcium: 10 mg/dL (ref 8.9–10.3)
GFR calc non Af Amer: 27 mL/min — ABNORMAL LOW (ref >60)
GFR, EST AFRICAN AMERICAN: 31 mL/min — AB (ref >60)
Glucose, Bld: 98 mg/dL (ref 65–99)
Potassium: 4.5 mmol/L (ref 3.5–5.1)
SODIUM: 137 mmol/L (ref 135–145)
Total Bilirubin: 0.9 mg/dL (ref 0.3–1.2)
Total Protein: 7.2 g/dL (ref 6.5–8.1)

## 2015-05-12 LAB — CBC
HEMATOCRIT: 39 % (ref 36.0–46.0)
HEMOGLOBIN: 12.8 g/dL (ref 12.0–15.0)
MCH: 30.5 pg (ref 26.0–34.0)
MCHC: 32.8 g/dL (ref 30.0–36.0)
MCV: 92.9 fL (ref 78.0–100.0)
Platelets: 225 10*3/uL (ref 150–400)
RBC: 4.2 MIL/uL (ref 3.87–5.11)
RDW: 14.2 % (ref 11.5–15.5)
WBC: 12.6 10*3/uL — AB (ref 4.0–10.5)

## 2015-05-12 LAB — URINE MICROSCOPIC-ADD ON

## 2015-05-12 LAB — CBG MONITORING, ED: Glucose-Capillary: 97 mg/dL (ref 65–99)

## 2015-05-12 MED ORDER — CEPHALEXIN 500 MG PO CAPS: 500.0000 mg | ORAL_CAPSULE | Freq: Two times a day (BID) | ORAL | Status: DC

## 2015-05-12 MED ORDER — DEXTROSE 5 % IV SOLN
1.0000 g | Freq: Once | INTRAVENOUS | Status: AC
  Administered 2015-05-12: 1 g via INTRAVENOUS
  Filled 2015-05-12: qty 10

## 2015-05-12 NOTE — ED Notes (Signed)
Spoke with pt daughter and she describes a rapid decline in her mothers health over the past week.  Pt had been having increasing confusion/falls and "shaking" before this too.  Pt had been to the point where the SNF had to keep her by the nurses station in a recliner in order to keep her safe from falling.

## 2015-05-12 NOTE — ED Notes (Signed)
Attempted to stick pt twice. No success. Phlebotomy to attempt.

## 2015-05-12 NOTE — Discharge Instructions (Signed)
Urinary Tract Infection Ms. Alexis Thompson, take keflex for treatment of your urine infection.  See a primary care doctor within 3 days for close follow up.  If any symptoms worsen, come back to the ED immediately.  Thank you. A urinary tract infection (UTI) can occur any place along the urinary tract. The tract includes the kidneys, ureters, bladder, and urethra. A type of germ called bacteria often causes a UTI. UTIs are often helped with antibiotic medicine.  HOME CARE   If given, take antibiotics as told by your doctor. Finish them even if you start to feel better.  Drink enough fluids to keep your pee (urine) clear or pale yellow.  Avoid tea, drinks with caffeine, and bubbly (carbonated) drinks.  Pee often. Avoid holding your pee in for a long time.  Pee before and after having sex (intercourse).  Wipe from front to back after you poop (bowel movement) if you are a woman. Use each tissue only once. GET HELP RIGHT AWAY IF:   You have back pain.  You have lower belly (abdominal) pain.  You have chills.  You feel sick to your stomach (nauseous).  You throw up (vomit).  Your burning or discomfort with peeing does not go away.  You have a fever.  Your symptoms are not better in 3 days. MAKE SURE YOU:   Understand these instructions.  Will watch your condition.  Will get help right away if you are not doing well or get worse.   This information is not intended to replace advice given to you by your health care provider. Make sure you discuss any questions you have with your health care provider.   Document Released: 11/16/2007 Document Revised: 06/20/2014 Document Reviewed: 12/29/2011 Elsevier Interactive Patient Education [employer].

## 2015-05-12 NOTE — ED Notes (Signed)
Patient transported to X-ray 

## 2015-05-12 NOTE — ED Notes (Signed)
Patient transported to CT 

## 2015-05-12 NOTE — ED Notes (Signed)
Report called to [facility], spoke with arlene and [facility]

## 2015-05-12 NOTE — ED Provider Notes (Signed)
CSN: [id number]     Arrival date & time 05/12/15  0219 History  By signing my name below, I, Phillis Haggis, attest that this documentation has been prepared under the direction and in the presence of Tomasita Crumble, MD. Electronically Signed: Phillis Haggis, ED Scribe. 05/12/2015. 3:42 AM.   Chief Complaint  Patient presents with  . Hallucinations   The history is provided by the patient. No language interpreter was used.  HPI Comments (Level 5 caveat due to dementia): Alexis Thompson is a 74 y.o. Female with a hx of Bipolar 1 Disorder, DM, depression, psychosis, and suicide attempt brought in by EMS who presents to the Emergency Department complaining of hallucinations. Per triage note, pt came from [facility] where she was being combative, rolling around on the floor, and seeing things that were not there. She would oppose to getting into a wheelchair and would get back onto the floor. Pt displays confusion which is baseline for her. She was given Ativan and Tramadol at 11 PM this evening. Pt is currently complaining of sore throat and fever. She denies cough or any other pain.   Past Medical History  Diagnosis Date  . Bipolar 1 disorder (HCC)   . Diabetes mellitus without complication (HCC)   . Hypertension   . High cholesterol   . GERD (gastroesophageal reflux disease)   . Depression   . Psychosis   . Suicide attempt by multiple drug overdose [facility])    History reviewed. No pertinent past surgical history. No family history on file. Social History  Substance Use Topics  . Smoking status: Never Smoker   . Smokeless tobacco: None  . Alcohol Use: No   OB History    No data available     Review of Systems  Unable to perform ROS: Dementia   Allergies  Codeine; Glipizide; and Tetracyclines & related  Home Medications   Prior to Admission medications   Medication Sig Start Date End Date Taking? Authorizing Provider  acetaminophen (TYLENOL) 325 MG tablet Take 650 mg by mouth  every 6 (six) hours.   Yes Historical Provider, MD  amantadine (SYMMETREL) 100 MG capsule Take 100 mg by mouth 2 (two) times daily.   Yes Historical Provider, MD  aspirin EC 81 MG tablet Take 81 mg by mouth daily.   Yes Historical Provider, MD  buPROPion (WELLBUTRIN XL) 300 MG 24 hr tablet Take 300 mg by mouth daily.   Yes Historical Provider, MD  clorazepate (TRANXENE) 3.75 MG tablet Take 3.75 mg by mouth 2 (two) times daily.   Yes Historical Provider, MD  FLUoxetine (PROZAC) 20 MG capsule Take 20 mg by mouth daily.   Yes Historical Provider, MD  gabapentin (NEURONTIN) 100 MG capsule Take 100 mg by mouth at bedtime.   Yes Historical Provider, MD  lamoTRIgine (LAMICTAL) 200 MG tablet Take 200 mg by mouth daily.   Yes Historical Provider, MD  LORazepam (ATIVAN) 2 MG/ML concentrated solution Take 2 mg by mouth once.   Yes Historical Provider, MD  losartan (COZAAR) 25 MG tablet Take 25 mg by mouth daily.   Yes Historical Provider, MD  Melatonin 5 MG TABS Take 5 mg by mouth at bedtime.   Yes Historical Provider, MD  metoprolol tartrate (LOPRESSOR) 25 MG tablet Take 12.5 mg by mouth 2 (two) times daily. Hold for SBP < 110 and HR <60   Yes Historical Provider, MD  nystatin (MYCOSTATIN/NYSTOP) 100000 UNIT/GM POWD Apply topically 2 (two) times daily as needed.   Yes  Historical Provider, MD  omeprazole (PRILOSEC) 40 MG capsule Take 40 mg by mouth daily.   Yes Historical Provider, MD  oxybutynin (DITROPAN-XL) 10 MG 24 hr tablet Take 10 mg by mouth every morning.   Yes Historical Provider, MD  simvastatin (ZOCOR) 20 MG tablet Take 20 mg by mouth daily.   Yes Historical Provider, MD  traMADol (ULTRAM) 50 MG tablet Take by mouth every 6 (six) hours as needed. 1-2 tabs PO Q 6 hours PRN   Yes Historical Provider, MD   BP 179/138 mmHg  Pulse 101  Resp 18  SpO2 93% Physical Exam  Constitutional: She appears well-developed and well-nourished. No distress.  HENT:  Head: Normocephalic and atraumatic.  Nose:  Nose normal.  Mouth/Throat: Oropharynx is clear and moist. No oropharyngeal exudate.  Eyes: Conjunctivae and EOM are normal. Pupils are equal, round, and reactive to light. No scleral icterus.  Neck: Normal range of motion. Neck supple. No JVD present. No tracheal deviation present. No thyromegaly present.  Cardiovascular: Normal rate, regular rhythm and normal heart sounds.  Exam reveals no gallop and no friction rub.   No murmur heard. Pulmonary/Chest: Effort normal and breath sounds normal. No respiratory distress. She has no wheezes. She exhibits no tenderness.  Abdominal: Soft. Bowel sounds are normal. She exhibits no distension and no mass. There is no tenderness. There is no rebound and no guarding.  Musculoskeletal: Normal range of motion. She exhibits no edema or tenderness.  Lymphadenopathy:    She has no cervical adenopathy.  Neurological: She is alert. No cranial nerve deficit. She exhibits normal muscle tone.  Frequent bilateral UE tremors; pt speaking to herself incomprehensibly  Skin: Skin is warm and dry. No rash noted. No erythema. No pallor.  Nursing note and vitals reviewed.   ED Course  Procedures (including critical care time) DIAGNOSTIC STUDIES: Oxygen Saturation is 93% on RA, low by my interpretation.    COORDINATION OF CARE: 3:22 AM-labs  Labs Review Labs Reviewed  COMPREHENSIVE METABOLIC PANEL - Abnormal; Notable for the following:    CO2 20 (*)    BUN 58 (*)    Creatinine, Ser 1.81 (*)    GFR calc non Af Amer 27 (*)    GFR calc Af Amer 31 (*)    All other components within normal limits  CBC - Abnormal; Notable for the following:    WBC 12.6 (*)    All other components within normal limits  URINALYSIS, ROUTINE W REFLEX MICROSCOPIC (NOT AT [facility]) - Abnormal; Notable for the following:    APPearance TURBID (*)    Hgb urine dipstick LARGE (*)    Ketones, ur 15 (*)    Protein, ur 100 (*)    Nitrite POSITIVE (*)    Leukocytes, UA LARGE (*)    All other  components within normal limits  URINE MICROSCOPIC-ADD ON - Abnormal; Notable for the following:    Squamous Epithelial / LPF 0-5 (*)    Bacteria, UA MANY (*)    All other components within normal limits  CBG MONITORING, ED    Imaging Review Dg Chest 1 View  05/12/2015  CLINICAL DATA:  Fever and sore throat. EXAM: CHEST 1 VIEW COMPARISON:  09/03/2013 FINDINGS: A single AP portable view of the chest demonstrates no focal airspace consolidation or alveolar edema. The lungs are grossly clear. There is no large effusion or pneumothorax. Cardiac and mediastinal contours appear unremarkable. IMPRESSION: No active disease. Electronically Signed   By: Rosey Bath.D.  On: 05/12/2015 03:55   Ct Head Wo Contrast  05/12/2015  CLINICAL DATA:  Visual hallucinations.  No known trauma. EXAM: CT HEAD WITHOUT CONTRAST TECHNIQUE: Contiguous axial images were obtained from the base of the skull through the vertex without intravenous contrast. COMPARISON:  02/09/2015 FINDINGS: There is no intracranial hemorrhage, mass or evidence of acute infarction. There is hemispheric white matter hypodensity consistent with chronic disease. There is moderate generalized atrophy. No interval change is evident. No bone abnormalities evident. The visible paranasal sinuses are clear. IMPRESSION: No acute intracranial findings. There is severe chronic white matter disease and moderate generalized atrophy, without significant interval change. Electronically Signed   By: Ellery Plunkaniel R Mitchell M.D.   On: 05/12/2015 05:10   I have personally reviewed and evaluated these images and lab results as part of my medical decision-making.   EKG Interpretation None      MDM   Final diagnoses:  None   Patient presents to the ED for hallucinations.  She is unable to give me any further history.  Will obtain blood work and infection work up for evaluation.  She is moving all 4 extremities in the ED but will not follow my commands, which  is her baseline per EMS.  Urinalysis reveals an infection, likely cause of patient's symptoms. Laboratory studies are otherwise unremarkable, chest x-ray and CT scan are normal. Patient was given ceftriaxone will be discharged with Keflex to take for treatment. She appears well and in no acute distress, tachycardia has resolved, without signs were within her normal limits and she is safe for discharge.  I personally performed the services described in this documentation, which was scribed in my presence. The recorded information has been reviewed and is accurate.     Tomasita CrumbleAdeleke Kaytelynn Scripter, MD 05/12/15 [phone]

## 2015-05-12 NOTE — ED Notes (Signed)
Per EMS [facility]  Pt has been there for the past month for strengthening  Staff states pt has been rolling around on the floor and being combative and seeing things that were not there  Attempted to put pt in wheelchair but pt would get back in the floor  Pt is appropriate with EMS  Displays some confusion which is normal for the pt  Pt is a DNR  Pt has a psych history of bipolar disorder  Pt was given ativan around 2300  Pt has taken her nightly medications  Pt was given tramadol this evening as well

## 2015-05-12 NOTE — ED Notes (Signed)
Pt daughter called and tells me that pt has been having rapid decline in past week.  Increasing agitation, confusion, falls, tremor. Pt has been having hallucinations with this also.

## 2015-05-12 NOTE — ED Notes (Signed)
Pt has an indwelling catheter that was not draining because leg bag was too tight. Once loosened it began to drain into a clean urine bag.  Therefore In and out cath was not done but urine sample was collected.

## 2015-05-27 ENCOUNTER — Encounter (HOSPITAL_COMMUNITY): Payer: Self-pay | Admitting: Emergency Medicine

## 2015-05-27 ENCOUNTER — Inpatient Hospital Stay (HOSPITAL_COMMUNITY): Payer: Medicare Other

## 2015-05-27 ENCOUNTER — Inpatient Hospital Stay (HOSPITAL_COMMUNITY)
Admission: EM | Admit: 2015-05-27 | Discharge: 2015-06-03 | DRG: 871 | Disposition: A | Discharge status: Skilled Nursing Facility | Payer: Medicare Other | Attending: Family Medicine | Admitting: Family Medicine

## 2015-05-27 ENCOUNTER — Emergency Department (HOSPITAL_COMMUNITY): Payer: Medicare Other

## 2015-05-27 DIAGNOSIS — B965 Pseudomonas (aeruginosa) (mallei) (pseudomallei) as the cause of diseases classified elsewhere: Secondary | ICD-10-CM | POA: Diagnosis present

## 2015-05-27 DIAGNOSIS — Z66 Do not resuscitate: Secondary | ICD-10-CM | POA: Diagnosis present

## 2015-05-27 DIAGNOSIS — I6523 Occlusion and stenosis of bilateral carotid arteries: Secondary | ICD-10-CM | POA: Diagnosis present

## 2015-05-27 DIAGNOSIS — F0390 Unspecified dementia without behavioral disturbance: Secondary | ICD-10-CM | POA: Diagnosis present

## 2015-05-27 DIAGNOSIS — Z915 Personal history of self-harm: Secondary | ICD-10-CM

## 2015-05-27 DIAGNOSIS — G9340 Encephalopathy, unspecified: Secondary | ICD-10-CM | POA: Diagnosis present

## 2015-05-27 DIAGNOSIS — R259 Unspecified abnormal involuntary movements: Secondary | ICD-10-CM | POA: Diagnosis not present

## 2015-05-27 DIAGNOSIS — Z7982 Long term (current) use of aspirin: Secondary | ICD-10-CM | POA: Diagnosis not present

## 2015-05-27 DIAGNOSIS — F319 Bipolar disorder, unspecified: Secondary | ICD-10-CM | POA: Diagnosis present

## 2015-05-27 DIAGNOSIS — R32 Unspecified urinary incontinence: Secondary | ICD-10-CM | POA: Diagnosis present

## 2015-05-27 DIAGNOSIS — A419 Sepsis, unspecified organism: Secondary | ICD-10-CM | POA: Diagnosis present

## 2015-05-27 DIAGNOSIS — E870 Hyperosmolality and hypernatremia: Secondary | ICD-10-CM | POA: Diagnosis present

## 2015-05-27 DIAGNOSIS — E872 Acidosis: Secondary | ICD-10-CM | POA: Diagnosis present

## 2015-05-27 DIAGNOSIS — Z79899 Other long term (current) drug therapy: Secondary | ICD-10-CM | POA: Diagnosis not present

## 2015-05-27 DIAGNOSIS — Z22322 Carrier or suspected carrier of Methicillin resistant Staphylococcus aureus: Secondary | ICD-10-CM | POA: Diagnosis not present

## 2015-05-27 DIAGNOSIS — N179 Acute kidney failure, unspecified: Secondary | ICD-10-CM | POA: Insufficient documentation

## 2015-05-27 DIAGNOSIS — K1120 Sialoadenitis, unspecified: Secondary | ICD-10-CM | POA: Diagnosis present

## 2015-05-27 DIAGNOSIS — T7840XA Allergy, unspecified, initial encounter: Secondary | ICD-10-CM | POA: Diagnosis not present

## 2015-05-27 DIAGNOSIS — K219 Gastro-esophageal reflux disease without esophagitis: Secondary | ICD-10-CM | POA: Diagnosis present

## 2015-05-27 DIAGNOSIS — I10 Essential (primary) hypertension: Secondary | ICD-10-CM | POA: Diagnosis present

## 2015-05-27 DIAGNOSIS — E119 Type 2 diabetes mellitus without complications: Secondary | ICD-10-CM | POA: Diagnosis present

## 2015-05-27 DIAGNOSIS — R450 Nervousness: Secondary | ICD-10-CM | POA: Diagnosis not present

## 2015-05-27 DIAGNOSIS — L03221 Cellulitis of neck: Secondary | ICD-10-CM | POA: Diagnosis present

## 2015-05-27 DIAGNOSIS — R251 Tremor, unspecified: Secondary | ICD-10-CM | POA: Diagnosis present

## 2015-05-27 DIAGNOSIS — E860 Dehydration: Secondary | ICD-10-CM | POA: Diagnosis present

## 2015-05-27 DIAGNOSIS — R443 Hallucinations, unspecified: Secondary | ICD-10-CM

## 2015-05-27 DIAGNOSIS — N390 Urinary tract infection, site not specified: Secondary | ICD-10-CM | POA: Diagnosis present

## 2015-05-27 DIAGNOSIS — I639 Cerebral infarction, unspecified: Secondary | ICD-10-CM | POA: Diagnosis not present

## 2015-05-27 DIAGNOSIS — R410 Disorientation, unspecified: Secondary | ICD-10-CM | POA: Diagnosis present

## 2015-05-27 DIAGNOSIS — B952 Enterococcus as the cause of diseases classified elsewhere: Secondary | ICD-10-CM | POA: Diagnosis present

## 2015-05-27 DIAGNOSIS — E785 Hyperlipidemia, unspecified: Secondary | ICD-10-CM | POA: Diagnosis present

## 2015-05-27 DIAGNOSIS — G2401 Drug induced subacute dyskinesia: Secondary | ICD-10-CM | POA: Diagnosis present

## 2015-05-27 DIAGNOSIS — I509 Heart failure, unspecified: Secondary | ICD-10-CM | POA: Diagnosis not present

## 2015-05-27 DIAGNOSIS — K210 Gastro-esophageal reflux disease with esophagitis: Secondary | ICD-10-CM

## 2015-05-27 LAB — CBC WITH DIFFERENTIAL/PLATELET
BASOS PCT: 0 %
Basophils Absolute: 0 10*3/uL (ref 0.0–0.1)
EOS PCT: 0 %
Eosinophils Absolute: 0 10*3/uL (ref 0.0–0.7)
HEMATOCRIT: 40.5 % (ref 36.0–46.0)
Hemoglobin: 13.5 g/dL (ref 12.0–15.0)
LYMPHS ABS: 5.5 10*3/uL — AB (ref 0.7–4.0)
Lymphocytes Relative: 17 %
MCH: 31.1 pg (ref 26.0–34.0)
MCHC: 33.3 g/dL (ref 30.0–36.0)
MCV: 93.3 fL (ref 78.0–100.0)
MONO ABS: 1.9 10*3/uL — AB (ref 0.1–1.0)
MONOS PCT: 6 %
NEUTROS ABS: 24.9 10*3/uL — AB (ref 1.7–7.7)
Neutrophils Relative %: 77 %
PLATELETS: 245 10*3/uL (ref 150–400)
RBC: 4.34 MIL/uL (ref 3.87–5.11)
RDW: 15.7 % — AB (ref 11.5–15.5)
WBC: 32.3 10*3/uL — AB (ref 4.0–10.5)

## 2015-05-27 LAB — URINE MICROSCOPIC-ADD ON

## 2015-05-27 LAB — I-STAT CG4 LACTIC ACID, ED
Lactic Acid, Venous: 3.7 mmol/L (ref 0.5–2.0)
Lactic Acid, Venous: 1.5 mmol/L (ref 0.5–2.0)

## 2015-05-27 LAB — COMPREHENSIVE METABOLIC PANEL
ALT: 19 U/L (ref 14–54)
AST: 20 U/L (ref 15–41)
Albumin: 4.2 g/dL (ref 3.5–5.0)
Alkaline Phosphatase: 75 U/L (ref 38–126)
Anion gap: 20 — ABNORMAL HIGH (ref 5–15)
BILIRUBIN TOTAL: 1.1 mg/dL (ref 0.3–1.2)
BUN: 40 mg/dL — AB (ref 6–20)
CALCIUM: 10.2 mg/dL (ref 8.9–10.3)
CO2: 15 mmol/L — ABNORMAL LOW (ref 22–32)
Chloride: 107 mmol/L (ref 101–111)
Creatinine, Ser: 1.46 mg/dL — ABNORMAL HIGH (ref 0.44–1.00)
GFR calc non Af Amer: 35 mL/min — ABNORMAL LOW (ref >60)
GFR, EST AFRICAN AMERICAN: 41 mL/min — AB (ref >60)
Glucose, Bld: 192 mg/dL — ABNORMAL HIGH (ref 65–99)
POTASSIUM: 3.8 mmol/L (ref 3.5–5.1)
Sodium: 142 mmol/L (ref 135–145)
TOTAL PROTEIN: 7.6 g/dL (ref 6.5–8.1)

## 2015-05-27 LAB — URINALYSIS, ROUTINE W REFLEX MICROSCOPIC
GLUCOSE, UA: NEGATIVE mg/dL
KETONES UR: 15 mg/dL — AB
Nitrite: NEGATIVE
PH: 5.5 (ref 5.0–8.0)
Protein, ur: 30 mg/dL — AB
Specific Gravity, Urine: 1.022 (ref 1.005–1.030)

## 2015-05-27 LAB — TROPONIN I
TROPONIN I: 0.11 ng/mL — AB (ref <0.031)
TROPONIN I: 0.1 ng/mL — AB (ref <0.031)
Troponin I: 0.08 ng/mL — ABNORMAL HIGH (ref <0.031)

## 2015-05-27 LAB — PROTIME-INR
INR: 1.35 (ref 0.00–1.49)
Prothrombin Time: 16.7 seconds — ABNORMAL HIGH (ref 11.6–15.2)

## 2015-05-27 LAB — MRSA PCR SCREENING: MRSA by PCR: POSITIVE — AB

## 2015-05-27 MED ORDER — PIPERACILLIN-TAZOBACTAM 3.375 G IVPB
3.3750 g | Freq: Once | INTRAVENOUS | Status: AC
  Administered 2015-05-27: 3.375 g via INTRAVENOUS
  Filled 2015-05-27: qty 50

## 2015-05-27 MED ORDER — METOPROLOL TARTRATE 1 MG/ML IV SOLN
5.0000 mg | INTRAVENOUS | Status: DC | PRN
  Administered 2015-05-28 (×2): 5 mg via INTRAVENOUS
  Filled 2015-05-27 (×2): qty 5

## 2015-05-27 MED ORDER — HALOPERIDOL LACTATE 5 MG/ML IJ SOLN
1.0000 mg | Freq: Four times a day (QID) | INTRAMUSCULAR | Status: DC | PRN
  Administered 2015-05-27: 1 mg via INTRAVENOUS
  Filled 2015-05-27: qty 1

## 2015-05-27 MED ORDER — LAMOTRIGINE 200 MG PO TABS
200.0000 mg | ORAL_TABLET | Freq: Every day | ORAL | Status: DC
  Administered 2015-05-27 – 2015-06-03 (×6): 200 mg via ORAL
  Filled 2015-05-27 (×8): qty 1

## 2015-05-27 MED ORDER — CHLORHEXIDINE GLUCONATE CLOTH 2 % EX PADS
6.0000 | MEDICATED_PAD | Freq: Every day | CUTANEOUS | Status: AC
  Administered 2015-05-28 – 2015-06-01 (×5): 6 via TOPICAL

## 2015-05-27 MED ORDER — GUAIFENESIN-DM 100-10 MG/5ML PO SYRP: 5.0000 mL | ORAL_SOLUTION | ORAL | Status: DC | PRN

## 2015-05-27 MED ORDER — FLUOXETINE HCL 20 MG PO CAPS
20.0000 mg | ORAL_CAPSULE | Freq: Every day | ORAL | Status: DC
  Administered 2015-05-27 – 2015-06-03 (×6): 20 mg via ORAL
  Filled 2015-05-27 (×10): qty 1

## 2015-05-27 MED ORDER — CETYLPYRIDINIUM CHLORIDE 0.05 % MT LIQD
7.0000 mL | Freq: Two times a day (BID) | OROMUCOSAL | Status: DC
  Administered 2015-05-28 – 2015-06-03 (×14): 7 mL via OROMUCOSAL

## 2015-05-27 MED ORDER — VANCOMYCIN HCL IN DEXTROSE 1-5 GM/200ML-% IV SOLN
1000.0000 mg | INTRAVENOUS | Status: DC
  Administered 2015-05-28 – 2015-05-29 (×2): 1000 mg via INTRAVENOUS
  Filled 2015-05-27 (×2): qty 200

## 2015-05-27 MED ORDER — HYDRALAZINE HCL 20 MG/ML IJ SOLN
10.0000 mg | INTRAMUSCULAR | Status: DC | PRN
  Administered 2015-05-29: 10 mg via INTRAVENOUS
  Filled 2015-05-27: qty 1

## 2015-05-27 MED ORDER — METOPROLOL TARTRATE 12.5 MG HALF TABLET
12.5000 mg | ORAL_TABLET | Freq: Two times a day (BID) | ORAL | Status: DC
  Administered 2015-05-27 (×2): 12.5 mg via ORAL
  Filled 2015-05-27 (×3): qty 1

## 2015-05-27 MED ORDER — LORAZEPAM 2 MG/ML IJ SOLN
1.0000 mg | Freq: Once | INTRAMUSCULAR | Status: AC
  Administered 2015-05-27: 1 mg via INTRAVENOUS
  Filled 2015-05-27: qty 1

## 2015-05-27 MED ORDER — ONDANSETRON HCL 4 MG/2ML IJ SOLN: 4.0000 mg | Freq: Four times a day (QID) | INTRAMUSCULAR | Status: DC | PRN

## 2015-05-27 MED ORDER — SODIUM CHLORIDE 0.9 % IV SOLN
INTRAVENOUS | Status: AC
  Administered 2015-05-28: 01:00:00 via INTRAVENOUS

## 2015-05-27 MED ORDER — HEPARIN SODIUM (PORCINE) 5000 UNIT/ML IJ SOLN
5000.0000 [IU] | Freq: Three times a day (TID) | INTRAMUSCULAR | Status: DC
  Administered 2015-05-27 – 2015-06-03 (×21): 5000 [IU] via SUBCUTANEOUS
  Filled 2015-05-27 (×21): qty 1

## 2015-05-27 MED ORDER — ONDANSETRON HCL 4 MG PO TABS: 4.0000 mg | ORAL_TABLET | Freq: Four times a day (QID) | ORAL | Status: DC | PRN

## 2015-05-27 MED ORDER — AMANTADINE HCL 100 MG PO CAPS
100.0000 mg | ORAL_CAPSULE | Freq: Two times a day (BID) | ORAL | Status: DC
  Administered 2015-05-27 – 2015-06-03 (×9): 100 mg via ORAL
  Filled 2015-05-27 (×17): qty 1

## 2015-05-27 MED ORDER — ACETAMINOPHEN 650 MG RE SUPP
650.0000 mg | Freq: Once | RECTAL | Status: AC
  Administered 2015-05-27: 650 mg via RECTAL
  Filled 2015-05-27: qty 1

## 2015-05-27 MED ORDER — BUPROPION HCL ER (XL) 150 MG PO TB24
150.0000 mg | ORAL_TABLET | Freq: Every day | ORAL | Status: DC
Start: 2015-05-27 — End: 2015-06-03
  Administered 2015-05-27 – 2015-06-03 (×7): 150 mg via ORAL
  Filled 2015-05-27 (×10): qty 1

## 2015-05-27 MED ORDER — LIP MEDEX EX OINT
TOPICAL_OINTMENT | CUTANEOUS | Status: AC
  Administered 2015-05-27: 1
  Filled 2015-05-27: qty 7

## 2015-05-27 MED ORDER — SODIUM CHLORIDE 0.9 % IJ SOLN
3.0000 mL | Freq: Two times a day (BID) | INTRAMUSCULAR | Status: DC
  Administered 2015-05-27 – 2015-06-03 (×6): 3 mL via INTRAVENOUS

## 2015-05-27 MED ORDER — BENZTROPINE MESYLATE 1 MG/ML IJ SOLN
1.0000 mg | Freq: Two times a day (BID) | INTRAMUSCULAR | Status: AC
  Administered 2015-05-27 (×2): 1 mg via INTRAVENOUS
  Filled 2015-05-27: qty 1
  Filled 2015-05-27: qty 2

## 2015-05-27 MED ORDER — VANCOMYCIN HCL 10 G IV SOLR
1500.0000 mg | Freq: Once | INTRAVENOUS | Status: AC
  Administered 2015-05-27: 1500 mg via INTRAVENOUS
  Filled 2015-05-27: qty 1500

## 2015-05-27 MED ORDER — CHLORHEXIDINE GLUCONATE 0.12 % MT SOLN
15.0000 mL | Freq: Two times a day (BID) | OROMUCOSAL | Status: DC
  Administered 2015-05-27 – 2015-06-03 (×15): 15 mL via OROMUCOSAL
  Filled 2015-05-27 (×13): qty 15

## 2015-05-27 MED ORDER — ASPIRIN 300 MG RE SUPP
150.0000 mg | Freq: Every day | RECTAL | Status: DC
  Administered 2015-05-28: 150 mg via RECTAL
  Filled 2015-05-27 (×5): qty 1

## 2015-05-27 MED ORDER — MUPIROCIN 2 % EX OINT
1.0000 "application " | TOPICAL_OINTMENT | Freq: Two times a day (BID) | CUTANEOUS | Status: AC
  Administered 2015-05-27 – 2015-06-01 (×10): 1 via NASAL
  Filled 2015-05-27: qty 22

## 2015-05-27 MED ORDER — MORPHINE SULFATE (PF) 2 MG/ML IV SOLN
1.0000 mg | INTRAVENOUS | Status: DC | PRN
  Administered 2015-05-29: 1 mg via INTRAVENOUS
  Filled 2015-05-27 (×2): qty 1

## 2015-05-27 MED ORDER — SODIUM CHLORIDE 0.9 % IV BOLUS (SEPSIS)
2000.0000 mL | Freq: Once | INTRAVENOUS | Status: AC
  Administered 2015-05-27: 08:00:00 via INTRAVENOUS

## 2015-05-27 MED ORDER — PIPERACILLIN-TAZOBACTAM 3.375 G IVPB
3.3750 g | Freq: Three times a day (TID) | INTRAVENOUS | Status: DC
  Administered 2015-05-27 – 2015-06-01 (×14): 3.375 g via INTRAVENOUS
  Filled 2015-05-27 (×12): qty 50

## 2015-05-27 MED ORDER — GABAPENTIN 300 MG PO CAPS
300.0000 mg | ORAL_CAPSULE | Freq: Three times a day (TID) | ORAL | Status: DC
  Administered 2015-05-27 (×2): 300 mg via ORAL
  Filled 2015-05-27 (×3): qty 1

## 2015-05-27 MED ORDER — ASPIRIN EC 81 MG PO TBEC
81.0000 mg | DELAYED_RELEASE_TABLET | Freq: Every day | ORAL | Status: DC
  Administered 2015-05-27 – 2015-06-03 (×6): 81 mg via ORAL
  Filled 2015-05-27 (×9): qty 1

## 2015-05-27 NOTE — Consult Note (Signed)
Neurology Consultation Reason for Consult: Altered mental status and tremors Referring Physician: Burney GauzeSingh, P  CC: Altered mental stats  History is obtained from:daughter  HPI: Alexis Thompson is a 74 y.o. female with a history of bipolar disorder and history of mental status changes with mild infections(uti) who presents with psychosis, increased falls, and "tremors" for six weeks.    She was admitted to a geri-psych unit for hallucinations in March 2015. She then had improvement, though they recommended evaluation for dementia even at that time. She did well until September 2015 at which time she developed a urinary tract infection which caused significant altered mental status and prolonged hospitalization needing rehabilitation. She was then doing well in assisted living until about 6. weeks ago. She began having falls, and was [facility]. There, she was diagnosed with drug-induced akathisia.  She had been steadily improving up until Thursday at which point she she was able to be interactive, but still not back to baseline. On Saturday, began having recurrent hallucinations and therefore was brought into the emergency room.  Of note for quite some time before the abnormal movements began, the patient complained of a internal sensation of feeling the need to move. She does have a history of restless leg syndrome.   ROS: A 14 point ROS was performed and is negative except as noted in the HPI.   Past Medical History  Diagnosis Date  . Bipolar 1 disorder (HCC)   . Diabetes mellitus without complication (HCC)   . Hypertension   . High cholesterol   . GERD (gastroesophageal reflux disease)   . Depression   . Psychosis   . Suicide attempt by multiple drug overdose (HCC)      FHx: Restless leg syndrome   Social History:  reports that she has never smoked. She does not have any smokeless tobacco history on file. She reports that she does not drink alcohol or use illicit  drugs.   Exam: Current vital signs: BP 180/76 mmHg  Pulse 126  Temp(Src) 97.9 F (36.6 C) (Oral)  Resp 13  Ht 5\' 1"  (1.549 m)  Wt 88.6 kg (195 lb 5.2 oz)  BMI 36.93 kg/m2  SpO2 90% Vital signs in last 24 hours: Temp:  [97.9 F (36.6 C)-100.5 F (38.1 C)] 97.9 F (36.6 C) (12/14 1600) Pulse Rate:  [67-126] 126 (12/14 1532) Resp:  [13-28] 13 (12/14 1532) BP: (109-195)/(31-157) 180/76 mmHg (12/14 1532) SpO2:  [90 %-100 %] 90 % (12/14 1532) Weight:  [88.6 kg (195 lb 5.2 oz)-97.07 kg (214 lb)] 88.6 kg (195 lb 5.2 oz) (12/14 1400)  Physical Exam  Constitutional: Appears well-developed and well-nourished.  Psych: ? respondign to unseen stimuli, but not clear.  Eyes: conjugate HENT: Swollen, tender parotid glands.  Head: Normocephalic.  Cardiovascular: Normal rate  Respiratory: Effort normal  GI: Soft.  There is no tenderness.  Skin: WDI  Neuro: Mental Status: Patient is awake, alert, gives location as [facility].  Cranial Nerves: II: Visual Fields are full. Pupils are equal, round, and reactive to light.   III,IV, VI: EOMI without ptosis or diploplia.  V: Facial sensation is symmetric to temperature VII: Facial movement is symmetric.  VIII: hearing is intact to voice X: Uvula elevates symmetrically XI: Shoulder shrug is symmetric. XII: tongue is midline without atrophy or fasciculations.  Motor: Tone is normal. Bulk is normal. 5/5 strength was present in all four extremities.  Sensory: Sensation is symmetric to light touch and temperature in the arms  and legs. Cerebellar: FNF and HKS are intact bilaterally  I have reviewed labs in epic and the results pertinent to this consultation are: ua - mod leuks Lactate 3.7  I have reviewed the images obtained:CT head - negative  Impression: 74 year old female with what appears to be akathisia, though severe chorea would be in the differential as well.  My suspicion remains that this is drug related given the timing and  the fact that neuroleptics often cause akathisa. The timing, however is unusual given that it is continuing despite having been off the medication for a month.    Recommendations: 1) gabapentin 300 mg 3 times a day 2) Cogentin 1 mg 4 times a day 3) treatment of underlying inflammatory processes   Ritta Slot, MD [facility] [phone]  If 7pm- 7am, please page neurology on call as listed in AMION.

## 2015-05-27 NOTE — ED Notes (Signed)
She remains agitated, but less so than before receiving IV ativan.  Dr. Thedore MinsSingh examines her as I write this in the presence of her husband.  She is in no distress.

## 2015-05-27 NOTE — ED Notes (Signed)
She becomes very agitated indeed; pulling at her lines/etc.  Dr. Thedore MinsSingh notified and orders rec'd.  We gently apply bilat. Soft wrist restraints; and I have just given her IV haldol. All this performed in the presence of her husband, to whom we explain our procedures and our desire to deep pt. Safe.

## 2015-05-27 NOTE — Progress Notes (Addendum)
ANTIBIOTIC CONSULT NOTE - INITIAL  Pharmacy Consult for Zosyn Indication: rule out sepsis  Allergies  Allergen Reactions  . Clindamycin/Lincomycin Anaphylaxis  . Codeine Other (See Comments)    Listed on MAR  . Glipizide Other (See Comments)    Listed on MAR  . Tetracyclines & Related Other (See Comments)    [facility] on [facility]    Patient Measurements: Weight: 214 lb (97.07 kg)  Vital Signs: Temp: 99.9 F (37.7 C) (12/14 1047) Temp Source: Rectal (12/14 1047) BP: 177/65 mmHg (12/14 1123) Pulse Rate: 71 (12/14 1123) Intake/Output from previous day:   Intake/Output from this shift:    Labs:  Recent Labs  05/27/15 0650  WBC 32.3*  HGB 13.5  PLT 245  CREATININE 1.46*   Estimated Creatinine Clearance: 36.9 mL/min (by C-G formula based on Cr of 1.46). No results for input(s): VANCOTROUGH, VANCOPEAK, VANCORANDOM, GENTTROUGH, GENTPEAK, GENTRANDOM, TOBRATROUGH, TOBRAPEAK, TOBRARND, AMIKACINPEAK, AMIKACINTROU, AMIKACIN in the last 72 hours.   Microbiology: Recent Results (from the past 720 hour(s))  Urine culture     Status: None (Preliminary result)   Collection Time: 05/27/15 10:47 AM  Result Value Ref Range Status   Specimen Description URINE, RANDOM  Final   Special Requests   Final    NONE Clindamycin x 2 days [facility]    Culture PENDING  Incomplete   Report Status PENDING  Incomplete    Medical History: Past Medical History  Diagnosis Date  . Bipolar 1 disorder (HCC)   . Diabetes mellitus without complication (HCC)   . Hypertension   . High cholesterol   . GERD (gastroesophageal reflux disease)   . Depression   . Psychosis   . Suicide attempt by multiple drug overdose (HCC)     Medications:  Anti-infectives    Start     Dose/Rate Route Frequency Ordered Stop   05/27/15 0845  piperacillin-tazobactam (ZOSYN) IVPB 3.375 g     3.375 g 12.5 mL/hr over 240 Minutes Intravenous  Once 05/27/15 0844 05/27/15 1000     Assessment: 74  y.o. female NH resident with PMH of dementia, bipolar HTN, tremor 2/2 antipsychotics presents 05/27/2015 with possible anaphylactic reaction to Clindamycin.  Placed on Clindamycin yesterday for nonpurulent cellulitis of the neck but developed allergic reaction shortly thereafter.  Febrile with elevated WBC and LA on presentation, now improved.  Pharmacy consulted to dose Zosyn for sepsis.  Also noted w/ UTI.  PTA clindamycin >> 12/14 12/14 >> Zosyn >>    **Cx drawn after Clindamycin** 12/14 blood: IP 12/14 urine: IP  Tmax: 100.5 WBC: elevated Renal: SCr elevated; CrCl 37 CG, 39 N LA now resolved   Goal of Therapy:  Eradication of infection Appropriate antibiotic dosing for indication and renal function  Plan:  Day 2 antibiotics Zosyn 3.375 g IV every 8 hrs by 4-hr infusion  Follow clinical course, renal function, culture results as available  Follow for de-escalation of antibiotics and LOT.  Plan to narrow as able following determination of etiology (Cellulitis vs Lemierre syndrome; also UTI)   Bernadene Personrew Brande Uncapher, PharmD, BCPS Pager: [phone] 05/27/2015, 12:01 PM

## 2015-05-27 NOTE — ED Provider Notes (Addendum)
CSN: [id number]     Arrival date & time 05/27/15  [id number] History   First MD Initiated Contact with Patient 05/27/15 [phone]     Chief Complaint  Patient presents with  . Allergic Reaction      HPI  Patient presents for evaluation of shortness of breath.  Patient is receiving [facility]. Developed a rash this morning and complained of dyspnea. EMS was called. Treated for possible anaphylaxis with Benadryl, nebulized albuterol, solu-Medrol, and epinephrine 1 mg doses 2 en route. Also IV Zantac 150.  Patient states that her neck starting hurting on Friday. Initial report was that she was admitted at [facility] over the weekend for infection. In speaking with family, and the care facility, as well as review of records from [facility] this is indeed not true. She was empirically started on clindamycin for a "neck infection yesterday after developing throat pain and neck pain over the weekend.  Has has  been getting the clindamycin since that time. However, nursing notes indicate that this is only the second dose of clindamycin at her SNF.  Past Medical History  Diagnosis Date  . Bipolar 1 disorder (HCC)   . Diabetes mellitus without complication (HCC)   . Hypertension   . High cholesterol   . GERD (gastroesophageal reflux disease)   . Depression   . Psychosis   . Suicide attempt by multiple drug overdose [facility](HCC)    History reviewed. No pertinent past surgical history. No family history on file. Social History  Substance Use Topics  . Smoking status: Never Smoker   . Smokeless tobacco: None  . Alcohol Use: No   OB History    No data available     Review of Systems  Constitutional: Negative for fever, chills, diaphoresis, appetite change and fatigue.  HENT: Negative for congestion, drooling, facial swelling, mouth sores, sore throat and trouble swallowing.   Eyes: Negative for visual disturbance.  Respiratory: Positive for shortness of breath. Negative  for cough, chest tightness and wheezing.   Cardiovascular: Negative for chest pain.  Gastrointestinal: Negative for nausea, vomiting, abdominal pain, diarrhea and abdominal distention.  Endocrine: Negative for polydipsia, polyphagia and polyuria.  Genitourinary: Negative for dysuria, frequency and hematuria.  Musculoskeletal: Positive for neck pain. Negative for gait problem.  Skin: Positive for rash. Negative for color change and pallor.  Neurological: Positive for tremors. Negative for dizziness, syncope, light-headedness and headaches.  Hematological: Does not bruise/bleed easily.  Psychiatric/Behavioral: Negative for behavioral problems and confusion.      Allergies  Clindamycin/lincomycin; Codeine; Glipizide; and Tetracyclines & related  Home Medications   Prior to Admission medications   Medication Sig Start Date End Date Taking? Authorizing Provider  acetaminophen (TYLENOL) 325 MG tablet Take 650 mg by mouth every 6 (six) hours as needed for headache.    Yes Historical Provider, MD  amantadine (SYMMETREL) 100 MG capsule Take 100 mg by mouth 2 (two) times daily.   Yes Historical Provider, MD  aspirin EC 81 MG tablet Take 81 mg by mouth daily.   Yes Historical Provider, MD  buPROPion (WELLBUTRIN XL) 300 MG 24 hr tablet Take 300 mg by mouth daily.   Yes Historical Provider, MD  clorazepate (TRANXENE) 7.5 MG tablet Take 7.5 mg by mouth daily.   Yes Historical Provider, MD  FLUoxetine (PROZAC) 20 MG capsule Take 20 mg by mouth daily.   Yes Historical Provider, MD  gabapentin (NEURONTIN) 100 MG capsule Take 100 mg by mouth at  bedtime.   Yes Historical Provider, MD  lamoTRIgine (LAMICTAL) 200 MG tablet Take 200 mg by mouth daily.   Yes Historical Provider, MD  Melatonin 5 MG TABS Take 5 mg by mouth at bedtime.   Yes Historical Provider, MD  metoprolol tartrate (LOPRESSOR) 25 MG tablet Take 12.5 mg by mouth 2 (two) times daily. Hold for SBP < 110 and HR <60   Yes Historical Provider, MD    nystatin (MYCOSTATIN/NYSTOP) 100000 UNIT/GM POWD Apply topically 2 (two) times daily as needed (stomach folds).    Yes Historical Provider, MD  omeprazole (PRILOSEC) 40 MG capsule Take 40 mg by mouth daily.   Yes Historical Provider, MD  oxybutynin (DITROPAN-XL) 10 MG 24 hr tablet Take 10 mg by mouth every morning.   Yes Historical Provider, MD  saccharomyces boulardii (FLORASTOR) 250 MG capsule Take 250 mg by mouth 2 (two) times daily.   Yes Historical Provider, MD  simvastatin (ZOCOR) 20 MG tablet Take 20 mg by mouth daily.   Yes Historical Provider, MD  traMADol (ULTRAM) 50 MG tablet Take 50-100 mg by mouth every 6 (six) hours as needed for moderate pain.    Yes Historical Provider, MD  clindamycin (CLEOCIN) 300 MG capsule Take 300 mg by mouth every 6 (six) hours.    Historical Provider, MD   BP 146/99 mmHg  Pulse 80  Temp(Src) 99.9 F (37.7 C) (Rectal)  Resp 16  Wt 214 lb (97.07 kg)  SpO2 97% Physical Exam  Constitutional: She is oriented to person, place, and time. She appears well-developed and well-nourished. She appears distressed.  HENT:  Head: Normocephalic.  Mouth/Throat: Mucous membranes are dry. No uvula swelling. No posterior oropharyngeal edema.    No drooling or stridor. Uvula midline. Normal-appearing tonsils. No asymmetry or swelling. Floor the mouth soft.  Eyes: Conjunctivae are normal. Pupils are equal, round, and reactive to light. No scleral icterus.  Neck: Normal range of motion. Neck supple. No thyromegaly present.    Tenderness and soft tissue swelling and induration to the left lateral neck. None to the midline neck. Not drooling.  Cardiovascular: Normal rate and regular rhythm.  Exam reveals no gallop and no friction rub.   No murmur heard. Pulmonary/Chest: Effort normal and breath sounds normal. No respiratory distress. She has no wheezes. She has no rales.  Clear bilateral breath sounds. No increased work of breathing. No wheezing.  Abdominal: Soft.  Bowel sounds are normal. She exhibits no distension. There is no tenderness. There is no rebound.  Musculoskeletal: Normal range of motion.  Neurological: She is alert and oriented to person, place, and time. She displays tremor.  Skin: Skin is warm and dry. No rash noted.  Diffuse erythema of the skin, no organized rash, no urticaria.  Psychiatric: She has a normal mood and affect. Her behavior is normal.    ED Course  Procedures (including critical care time) Labs Review Labs Reviewed  CBC WITH DIFFERENTIAL/PLATELET - Abnormal; Notable for the following:    WBC 32.3 (*)    RDW 15.7 (*)    Neutro Abs 24.9 (*)    Lymphs Abs 5.5 (*)    Monocytes Absolute 1.9 (*)    All other components within normal limits  COMPREHENSIVE METABOLIC PANEL - Abnormal; Notable for the following:    CO2 15 (*)    Glucose, Bld 192 (*)    BUN 40 (*)    Creatinine, Ser 1.46 (*)    GFR calc non Af Amer 35 (*)  GFR calc Af Amer 41 (*)    Anion gap 20 (*)    All other components within normal limits  URINALYSIS, ROUTINE W REFLEX MICROSCOPIC (NOT AT [facility]) - Abnormal; Notable for the following:    APPearance CLOUDY (*)    Hgb urine dipstick MODERATE (*)    Bilirubin Urine MODERATE (*)    Ketones, ur 15 (*)    Protein, ur 30 (*)    Leukocytes, UA MODERATE (*)    All other components within normal limits  TROPONIN I - Abnormal; Notable for the following:    Troponin I 0.11 (*)    All other components within normal limits  URINE MICROSCOPIC-ADD ON - Abnormal; Notable for the following:    Squamous Epithelial / LPF 0-5 (*)    Bacteria, UA MANY (*)    Casts HYALINE CASTS (*)    All other components within normal limits  I-STAT CG4 LACTIC ACID, ED - Abnormal; Notable for the following:    Lactic Acid, Venous 3.70 (*)    All other components within normal limits  CULTURE, BLOOD (ROUTINE X 2)  CULTURE, BLOOD (ROUTINE X 2)  URINE CULTURE  I-STAT CG4 LACTIC ACID, ED  I-STAT CG4 LACTIC ACID, ED  I-STAT  CG4 LACTIC ACID, ED    Imaging Review Ct Soft Tissue Neck Wo Contrast  05/27/2015  CLINICAL DATA:  74 year old female with anaphylaxis and neck swelling EXAM: CT NECK WITHOUT CONTRAST TECHNIQUE: Multidetector CT imaging of the neck was performed following the standard protocol without intravenous contrast. COMPARISON:  Prior CT scan of the head 05/12/2015 prior CT scan of the cervical spine 02/24/2014 FINDINGS: Visualized intracranial contents are unremarkable save for stable cerebral atrophy. The globes and orbits are symmetric and within normal limits bilaterally. Mild reticulation of the subcutaneous fat overlying both parotid glands and extending down to the level of the mandible. Additionally, there is hypo density within the slightly enlarged tongue suggesting the presence of edema. Normal aeration of mastoid air cells and visualized paranasal sinuses. The nasopharyngeal and oro pharyngeal airways remain patent. The epiglottis is well-defined and not particularly edematous. Perhaps mild edema of the aryepiglottic folds. The subglottic airway is patent. The visualized lung apices are within normal limits. 2 vessel aortic arch. The brachiocephalic and left common carotid artery share a common origin. The great vessels are highly tortuous. The thyroid gland and thoracic inlet are unremarkable. No acute osseous abnormality. Atherosclerotic calcifications are present in the right and left carotid bifurcations. IMPRESSION: 1. No significant narrowing of the airway. 2. Reticulation of the subcutaneous fat of the bilateral cheeks extending from the temporal fossa to the mandible consistent with edema. 3. Suggestion of mild edema of the tongue and aryepiglottic folds. 4. Calcifications in the bilateral carotid artery bifurcations. Consider further evaluation with dedicated carotid ultrasound to assess for underlying stenosis. Electronically Signed   By: Malachy Moan M.D.   On: 05/27/2015 09:03   Dg Chest  Port 1 View  05/27/2015  CLINICAL DATA:  Shortness of breath today. Hypertension. Nonsmoker. EXAM: PORTABLE CHEST 1 VIEW COMPARISON:  05/12/2015; 09/03/2013; 01/22/2013 FINDINGS: Grossly unchanged cardiac silhouette and mediastinal contours given reduced lung volumes. Atherosclerotic plaque within the thoracic aorta. Worsening bilateral infrahilar heterogeneous slightly nodular interstitial opacities. No pleural effusion or pneumothorax. No evidence of edema. No acute osseus abnormalities. IMPRESSION: 1. No definite acute cardiopulmonary disease on this hypoventilated AP portable examination. 2. Decreased lung volumes with worsening bilateral infrahilar opacities favored to represent atelectasis. Electronically Signed   By:  Simonne Come M.D.   On: 05/27/2015 07:27   I have personally reviewed and evaluated these images and lab results as part of my medical decision-making.   EKG Interpretation   Date/Time:  Wednesday May 27 2015 07:48:05 EST Ventricular Rate:  77 PR Interval:    QRS Duration: 98 QT Interval:  420 QTC Calculation: 475 R Axis:   28 Text Interpretation:  Sinus rhythm Artifact Confirmed by Fayrene Fearing  MD, Koben Daman  [phone]) on 05/27/2015 8:38:19 AM      MDM   Final diagnoses:  Cellulitis, neck  Allergic reaction, initial encounter    CT neck does not show localizing abscess. Does show soft tissue swelling/edema. The story I'm able to piece together, the patient's swelling and neck discomfort preceded her allergic reaction today. Rest, I think this is very likely cellulitis. No airway compromise radiographically, or clinically. She is not stridorous. She is able lie supine. She is maintaining saturations. She is now 4 hours in the emergency room status post initial treatment for her allergic reaction and his show no sign of progression of allergic reaction with hypotension, airway swelling, stridor, or recurrent rash.  Does have initial lactic acidosis at 3.7. Given fluids 2 L IV.  Recheck 1.50. Urine suggests sign of infection and culture pending. Given IV Zosyn.    Rolland Porter, MD 05/27/15 1114  Rolland Porter, MD 05/27/15 [phone]

## 2015-05-27 NOTE — ED Notes (Signed)
She is still mildly agitated and irritable, but much better than before Haldol.

## 2015-05-27 NOTE — Consult Note (Signed)
Reason for Consult:face swelling Referring Physician:ER  Alexis Thompson is an 74 y.o. female.  HPI: hx of reaction to clindamycin. The family states there was not swelling of the face until after the reaction. She is not having any airway issues. She is very dry of the mouth and that is chronic. She never had any stridor. No nasal obstruction.   Past Medical History  Diagnosis Date  . Bipolar 1 disorder ([place])   . Diabetes mellitus without complication ([place])   . Hypertension   . High cholesterol   . GERD (gastroesophageal reflux disease)   . Depression   . Psychosis   . Suicide attempt by multiple drug overdose [facility])     History reviewed. No pertinent past surgical history.  No family history on file.  Social History:  reports that she has never smoked. She does not have any smokeless tobacco history on file. She reports that she does not drink alcohol or use illicit drugs.  Allergies:  Allergies  Allergen Reactions  . Clindamycin/Lincomycin Anaphylaxis  . Codeine Other (See Comments)    Listed on MAR  . Glipizide Other (See Comments)    Listed on MAR  . Tetracyclines & Related Other (See Comments)    Listed on MAR    Medications: I have reviewed the patient's current medications.  Results for orders placed or performed during the hospital encounter of 05/27/15 (from the past 48 hour(s))  CBC with Differential     Status: Abnormal   Collection Time: 05/27/15  6:50 AM  Result Value Ref Range   WBC 32.3 (H) 4.0 - 10.5 K/uL   RBC 4.34 3.87 - 5.11 MIL/uL   Hemoglobin 13.5 12.0 - 15.0 g/dL   HCT 40.5 36.0 - 46.0 %   MCV 93.3 78.0 - 100.0 fL   MCH 31.1 26.0 - 34.0 pg   MCHC 33.3 30.0 - 36.0 g/dL   RDW 15.7 (H) 11.5 - 15.5 %   Platelets 245 150 - 400 K/uL   Neutrophils Relative % 77 %   Lymphocytes Relative 17 %   Monocytes Relative 6 %   Eosinophils Relative 0 %   Basophils Relative 0 %   Neutro Abs 24.9 (H) 1.7 - 7.7 K/uL   Lymphs Abs 5.5 (H) 0.7 - 4.0 K/uL   Monocytes Absolute 1.9 (H) 0.1 - 1.0 K/uL   Eosinophils Absolute 0.0 0.0 - 0.7 K/uL   Basophils Absolute 0.0 0.0 - 0.1 K/uL   Smear Review LARGE PLATELETS PRESENT   Comprehensive metabolic panel     Status: Abnormal   Collection Time: 05/27/15  6:50 AM  Result Value Ref Range   Sodium 142 135 - 145 mmol/L   Potassium 3.8 3.5 - 5.1 mmol/L   Chloride 107 101 - 111 mmol/L   CO2 15 (L) 22 - 32 mmol/L   Glucose, Bld 192 (H) 65 - 99 mg/dL   BUN 40 (H) 6 - 20 mg/dL   Creatinine, Ser 1.46 (H) 0.44 - 1.00 mg/dL   Calcium 10.2 8.9 - 10.3 mg/dL   Total Protein 7.6 6.5 - 8.1 g/dL   Albumin 4.2 3.5 - 5.0 g/dL   AST 20 15 - 41 U/L   ALT 19 14 - 54 U/L   Alkaline Phosphatase 75 38 - 126 U/L   Total Bilirubin 1.1 0.3 - 1.2 mg/dL   GFR calc non Af Amer 35 (L) >60 mL/min   GFR calc Af Amer 41 (L) >60 mL/min    Comment: (NOTE) The eGFR  has been calculated using the CKD EPI equation. This calculation has not been validated in all clinical situations. eGFR's persistently <60 mL/min signify possible Chronic Kidney Disease.    Anion gap 20 (H) 5 - 15  Troponin I     Status: Abnormal   Collection Time: 05/27/15  6:50 AM  Result Value Ref Range   Troponin I 0.11 (H) <0.031 ng/mL    Comment:        PERSISTENTLY INCREASED TROPONIN VALUES IN THE RANGE OF 0.04-0.49 ng/mL CAN BE SEEN IN:       -UNSTABLE ANGINA       -CONGESTIVE HEART FAILURE       -MYOCARDITIS       -CHEST TRAUMA       -ARRYHTHMIAS       -LATE PRESENTING MYOCARDIAL INFARCTION       -COPD   CLINICAL FOLLOW-UP RECOMMENDED.   I-Stat CG4 Lactic Acid, ED     Status: Abnormal   Collection Time: 05/27/15  7:02 AM  Result Value Ref Range   Lactic Acid, Venous 3.70 (HH) 0.5 - 2.0 mmol/L   Comment NOTIFIED PHYSICIAN   Urinalysis, Routine w reflex microscopic (not at [facility])     Status: Abnormal   Collection Time: 05/27/15  7:15 AM  Result Value Ref Range   Color, Urine YELLOW YELLOW   APPearance CLOUDY (A) CLEAR   Specific Gravity,  Urine 1.022 1.005 - 1.030   pH 5.5 5.0 - 8.0   Glucose, UA NEGATIVE NEGATIVE mg/dL   Hgb urine dipstick MODERATE (A) NEGATIVE   Bilirubin Urine MODERATE (A) NEGATIVE   Ketones, ur 15 (A) NEGATIVE mg/dL   Protein, ur 30 (A) NEGATIVE mg/dL   Nitrite NEGATIVE NEGATIVE   Leukocytes, UA MODERATE (A) NEGATIVE  Urine microscopic-add on     Status: Abnormal   Collection Time: 05/27/15  7:15 AM  Result Value Ref Range   Squamous Epithelial / LPF 0-5 (A) NONE SEEN   WBC, UA 6-30 0 - 5 WBC/hpf   RBC / HPF 6-30 0 - 5 RBC/hpf   Bacteria, UA MANY (A) NONE SEEN   Casts HYALINE CASTS (A) NEGATIVE   Urine-Other YEAST PRESENT   Urine culture     Status: None (Preliminary result)   Collection Time: 05/27/15 10:47 AM  Result Value Ref Range   Specimen Description URINE, RANDOM    Special Requests      NONE Clindamycin x 2 days Performed at [facility]    Culture PENDING    Report Status PENDING   I-Stat CG4 Lactic Acid, ED  (not at  [facility])     Status: None   Collection Time: 05/27/15 11:06 AM  Result Value Ref Range   Lactic Acid, Venous 1.50 0.5 - 2.0 mmol/L    Ct Soft Tissue Neck Wo Contrast  05/27/2015  CLINICAL DATA:  74 year old female with anaphylaxis and neck swelling EXAM: CT NECK WITHOUT CONTRAST TECHNIQUE: Multidetector CT imaging of the neck was performed following the standard protocol without intravenous contrast. COMPARISON:  Prior CT scan of the head 05/12/2015 prior CT scan of the cervical spine 02/24/2014 FINDINGS: Visualized intracranial contents are unremarkable save for stable cerebral atrophy. The globes and orbits are symmetric and within normal limits bilaterally. Mild reticulation of the subcutaneous fat overlying both parotid glands and extending down to the level of the mandible. Additionally, there is hypo density within the slightly enlarged tongue suggesting the presence of edema. Normal aeration of mastoid air cells and visualized  paranasal sinuses. The  nasopharyngeal and oro pharyngeal airways remain patent. The epiglottis is well-defined and not particularly edematous. Perhaps mild edema of the aryepiglottic folds. The subglottic airway is patent. The visualized lung apices are within normal limits. 2 vessel aortic arch. The brachiocephalic and left common carotid artery share a common origin. The great vessels are highly tortuous. The thyroid gland and thoracic inlet are unremarkable. No acute osseous abnormality. Atherosclerotic calcifications are present in the right and left carotid bifurcations. IMPRESSION: 1. No significant narrowing of the airway. 2. Reticulation of the subcutaneous fat of the bilateral cheeks extending from the temporal fossa to the mandible consistent with edema. 3. Suggestion of mild edema of the tongue and aryepiglottic folds. 4. Calcifications in the bilateral carotid artery bifurcations. Consider further evaluation with dedicated carotid ultrasound to assess for underlying stenosis. Electronically Signed   By: Jacqulynn Cadet M.D.   On: 05/27/2015 09:03   Dg Chest Port 1 View  05/27/2015  CLINICAL DATA:  Shortness of breath today. Hypertension. Nonsmoker. EXAM: PORTABLE CHEST 1 VIEW COMPARISON:  05/12/2015; 09/03/2013; 01/22/2013 FINDINGS: Grossly unchanged cardiac silhouette and mediastinal contours given reduced lung volumes. Atherosclerotic plaque within the thoracic aorta. Worsening bilateral infrahilar heterogeneous slightly nodular interstitial opacities. No pleural effusion or pneumothorax. No evidence of edema. No acute osseus abnormalities. IMPRESSION: 1. No definite acute cardiopulmonary disease on this hypoventilated AP portable examination. 2. Decreased lung volumes with worsening bilateral infrahilar opacities favored to represent atelectasis. Electronically Signed   By: Sandi Mariscal M.D.   On: 05/27/2015 07:27    ROS Blood pressure 177/65, pulse 71, temperature 99.9 F (37.7 C), temperature source Rectal,  resp. rate 18, weight 97.07 kg (214 lb), SpO2 99 %. Physical Exam  Constitutional: She appears well-developed and well-nourished.  HENT:  She has swelling over the left and right face with right greater. The parotids are swollen bilaterally and right is more firm. No fluctuance. Oral cavity/OP - extremely dry and no exudate or significant erythema. The tongue looks normal. Neck has some difuse subcutaneous edema.   Neck: Neck supple.    Assessment/Plan: Facial sweelling/parotitis- Ct scan doesn't reveal any abscess or airway issues. There is no clinical issue with the tongue as the CT scan indicated a hypodense area. she has swelling of parotid and submandibular glands likely secondary to the dry mouth. She may have a component of infection of the right parotid. I would recommend a Staph covering abx and heat to the glands. No intervention indicated at this point. Call if any further issues.   Melissa Montane 05/27/2015, 12:17 PM

## 2015-05-27 NOTE — H&P (Addendum)
Patient Demographics:    Alexis Thompson, is a 74 y.o. female  MRN: [id number]   DOB - 08-04-1940  Admit Date - 05/27/2015  Outpatient Primary MD for the patient is No PCP Per Patient   With History of -  Past Medical History  Diagnosis Date  . Bipolar 1 disorder (HCC)   . Diabetes mellitus without complication (HCC)   . Hypertension   . High cholesterol   . GERD (gastroesophageal reflux disease)   . Depression   . Psychosis   . Suicide attempt by multiple drug overdose [facility])       History reviewed. No pertinent past surgical history.  in for   Chief Complaint  Patient presents with  . Allergic Reaction      HPI:    Alexis Thompson  is a 74 y.o. female, who is a nursing home resident with history of undiagnosed early dementia per family, bipolar disorder with suicidal ideation in the past, essential hypertension, dyslipidemia, generalized tremors for the last 2-3 months thought to be due to Jordan side effect, intermittent hallucinations, multiple falls due to tremors and poor cognition who started complaining of neck pain and discomfort 2 days ago, she was started on clindamycin yesterday but developed an allergic reaction to it and was transferred to the ER today.   In the ER patient found to have early sepsis, lactic acid level was elevated, she was febrile with leukocytosis, with IV fluids her lactate has come down, she is afebrile currently, has received antibiotics, appropriate treatment for clindamycin allergy has been provided with steroids and epinephrine and she is now stable. Imaging of her neck suspicious for soft tissue neck infection, there is also calcification in the carotids noted, patient is having nonstop generalized tremors, has feeble voice and is clearly confused at baseline.   She is  unable to answer questions reliably however she denies any headache or chest pain, she does agree to throat discomfort with problems in swallowing and throat pain, no focal weakness. Denies any abdominal pain or diarrhea.    Review of systems:    In addition to the HPI above,  she is confused and unreliable historian but she agrees to neck pain and discomfort, generalized tremors, denies any other complaints. No Fever-chills, No Headache, No changes with Vision or hearing, No problems swallowing food or Liquids, No Chest pain, Cough or Shortness of Breath, No Abdominal pain, No Nausea or Vommitting, Bowel movements are regular, No Blood in stool or Urine, No dysuria, No new skin rashes or bruises, No new joints pains-aches,  No new weakness, tingling, numbness in any extremity, No recent weight gain or loss, No polyuria, polydypsia or polyphagia, No significant Mental Stressors.  A full 10 point Review of Systems was done, except as stated above, all other Review of Systems were negative.    Social History:     Social History  Substance Use Topics  .  Smoking status: Never Smoker   . Smokeless tobacco: Not on file  . Alcohol Use: No    Lives - at SNF, limited mobility due to poor cognition and nonstop tremors      Family History :   No family history of stroke   Home Medications:   Prior to Admission medications   Medication Sig Start Date End Date Taking? Authorizing Provider  acetaminophen (TYLENOL) 325 MG tablet Take 650 mg by mouth every 6 (six) hours as needed for headache.    Yes Historical Provider, MD  amantadine (SYMMETREL) 100 MG capsule Take 100 mg by mouth 2 (two) times daily.   Yes Historical Provider, MD  aspirin EC 81 MG tablet Take 81 mg by mouth daily.   Yes Historical Provider, MD  buPROPion (WELLBUTRIN XL) 300 MG 24 hr tablet Take 300 mg by mouth daily.   Yes Historical Provider, MD  clorazepate (TRANXENE) 7.5 MG tablet Take 7.5 mg by mouth daily.    Yes Historical Provider, MD  FLUoxetine (PROZAC) 20 MG capsule Take 20 mg by mouth daily.   Yes Historical Provider, MD  gabapentin (NEURONTIN) 100 MG capsule Take 100 mg by mouth at bedtime.   Yes Historical Provider, MD  lamoTRIgine (LAMICTAL) 200 MG tablet Take 200 mg by mouth daily.   Yes Historical Provider, MD  Melatonin 5 MG TABS Take 5 mg by mouth at bedtime.   Yes Historical Provider, MD  metoprolol tartrate (LOPRESSOR) 25 MG tablet Take 12.5 mg by mouth 2 (two) times daily. Hold for SBP < 110 and HR <60   Yes Historical Provider, MD  nystatin (MYCOSTATIN/NYSTOP) 100000 UNIT/GM POWD Apply topically 2 (two) times daily as needed (stomach folds).    Yes Historical Provider, MD  omeprazole (PRILOSEC) 40 MG capsule Take 40 mg by mouth daily.   Yes Historical Provider, MD  oxybutynin (DITROPAN-XL) 10 MG 24 hr tablet Take 10 mg by mouth every morning.   Yes Historical Provider, MD  saccharomyces boulardii (FLORASTOR) 250 MG capsule Take 250 mg by mouth 2 (two) times daily.   Yes Historical Provider, MD  simvastatin (ZOCOR) 20 MG tablet Take 20 mg by mouth daily.   Yes Historical Provider, MD  traMADol (ULTRAM) 50 MG tablet Take 50-100 mg by mouth every 6 (six) hours as needed for moderate pain.    Yes Historical Provider, MD  clindamycin (CLEOCIN) 300 MG capsule Take 300 mg by mouth every 6 (six) hours.    Historical Provider, MD     Allergies:     Allergies  Allergen Reactions  . Clindamycin/Lincomycin Anaphylaxis  . Codeine Other (See Comments)    Listed on MAR  . Glipizide Other (See Comments)    Listed on MAR  . Tetracyclines & Related Other (See Comments)    Listed on [facility]     Physical Exam:   Vitals  Blood pressure 177/65, pulse 71, temperature 99.9 F (37.7 C), temperature source Rectal, resp. rate 18, weight 97.07 kg (214 lb), SpO2 99 %.   1. General obtunded obese white femalelying in bed ,  2. Normal affect and insight, Not Suicidal or Homicidal, Awake ,  confused  3. No F.N deficits, ALL C.Nerves Intact, Strength 5/5 all 4 extremities, Sensation intact all 4 extremities, Plantars down going. Diffuse non stop generalized tremors  4. Ears and Eyes appear Normal, Conjunctivae clear, PERRLA. DRY Oral Mucosa.  5. Supple Neck, No JVD, No cervical lymphadenopathy appriciated, No Carotid Bruits.  6. Symmetrical Chest wall movement, Good  air movement bilaterally, CTAB.  7. RRR, No Gallops, Rubs or Murmurs, No Parasternal Heave.  8. Positive Bowel Sounds, Abdomen Soft, No tenderness, No organomegaly appriciated,No rebound -guarding or rigidity.  9.  No Cyanosis, Normal Skin Turgor, No Skin Rash or Bruise.  10. Good muscle tone,  joints appear normal , no effusions, Normal ROM.  11. No Palpable Lymph Nodes in Neck or Axillae      Data Review:    CBC  Recent Labs Lab 05/27/15 0650  WBC 32.3*  HGB 13.5  HCT 40.5  PLT 245  MCV 93.3  MCH 31.1  MCHC 33.3  RDW 15.7*  LYMPHSABS 5.5*  MONOABS 1.9*  EOSABS 0.0  BASOSABS 0.0   ------------------------------------------------------------------------------------------------------------------  Chemistries   Recent Labs Lab 05/27/15 0650  NA 142  K 3.8  CL 107  CO2 15*  GLUCOSE 192*  BUN 40*  CREATININE 1.46*  CALCIUM 10.2  AST 20  ALT 19  ALKPHOS 75  BILITOT 1.1   ------------------------------------------------------------------------------------------------------------------ estimated creatinine clearance is 36.9 mL/min (by C-G formula based on Cr of 1.46). ------------------------------------------------------------------------------------------------------------------ No results for input(s): TSH, T4TOTAL, T3FREE, THYROIDAB in the last 72 hours.  Invalid input(s): FREET3   Coagulation profile No results for input(s): INR, PROTIME in the last 168  hours. ------------------------------------------------------------------------------------------------------------------- No results for input(s): DDIMER in the last 72 hours. -------------------------------------------------------------------------------------------------------------------  Cardiac Enzymes  Recent Labs Lab 05/27/15 0650  TROPONINI 0.11*   ------------------------------------------------------------------------------------------------------------------ Invalid input(s): POCBNP   ---------------------------------------------------------------------------------------------------------------  Urinalysis    Component Value Date/Time   COLORURINE YELLOW 05/27/2015 0715   APPEARANCEUR CLOUDY* 05/27/2015 0715   LABSPEC 1.022 05/27/2015 0715   PHURINE 5.5 05/27/2015 0715   GLUCOSEU NEGATIVE 05/27/2015 0715   HGBUR MODERATE* 05/27/2015 0715   BILIRUBINUR MODERATE* 05/27/2015 0715   KETONESUR 15* 05/27/2015 0715   PROTEINUR 30* 05/27/2015 0715   UROBILINOGEN 0.2 09/03/2013 0259   NITRITE NEGATIVE 05/27/2015 0715   LEUKOCYTESUR MODERATE* 05/27/2015 0715    ----------------------------------------------------------------------------------------------------------------   Imaging Results:    Ct Soft Tissue Neck Wo Contrast  05/27/2015  CLINICAL DATA:  74 year old female with anaphylaxis and neck swelling EXAM: CT NECK WITHOUT CONTRAST TECHNIQUE: Multidetector CT imaging of the neck was performed following the standard protocol without intravenous contrast. COMPARISON:  Prior CT scan of the head 05/12/2015 prior CT scan of the cervical spine 02/24/2014 FINDINGS: Visualized intracranial contents are unremarkable save for stable cerebral atrophy. The globes and orbits are symmetric and within normal limits bilaterally. Mild reticulation of the subcutaneous fat overlying both parotid glands and extending down to the level of the mandible. Additionally, there is hypo density  within the slightly enlarged tongue suggesting the presence of edema. Normal aeration of mastoid air cells and visualized paranasal sinuses. The nasopharyngeal and oro pharyngeal airways remain patent. The epiglottis is well-defined and not particularly edematous. Perhaps mild edema of the aryepiglottic folds. The subglottic airway is patent. The visualized lung apices are within normal limits. 2 vessel aortic arch. The brachiocephalic and left common carotid artery share a common origin. The great vessels are highly tortuous. The thyroid gland and thoracic inlet are unremarkable. No acute osseous abnormality. Atherosclerotic calcifications are present in the right and left carotid bifurcations. IMPRESSION: 1. No significant narrowing of the airway. 2. Reticulation of the subcutaneous fat of the bilateral cheeks extending from the temporal fossa to the mandible consistent with edema. 3. Suggestion of mild edema of the tongue and aryepiglottic folds. 4. Calcifications in the bilateral carotid artery bifurcations. Consider further evaluation with  dedicated carotid ultrasound to assess for underlying stenosis. Electronically Signed   By: Malachy MoanHeath  McCullough M.D.   On: 05/27/2015 09:03   Dg Chest Port 1 View  05/27/2015  CLINICAL DATA:  Shortness of breath today. Hypertension. Nonsmoker. EXAM: PORTABLE CHEST 1 VIEW COMPARISON:  05/12/2015; 09/03/2013; 01/22/2013 FINDINGS: Grossly unchanged cardiac silhouette and mediastinal contours given reduced lung volumes. Atherosclerotic plaque within the thoracic aorta. Worsening bilateral infrahilar heterogeneous slightly nodular interstitial opacities. No pleural effusion or pneumothorax. No evidence of edema. No acute osseus abnormalities. IMPRESSION: 1. No definite acute cardiopulmonary disease on this hypoventilated AP portable examination. 2. Decreased lung volumes with worsening bilateral infrahilar opacities favored to represent atelectasis. Electronically Signed   By:  Simonne ComeJohn  Watts M.D.   On: 05/27/2015 07:27    My personal review of EKG: Rhythm NSR, Rate  77 /min,     Assessment & Plan:     1. Sepsis due to soft tissue neck infection.CT  Neck noted, blood cultures pending, had allergic reaction to clindamycin that for Will be placed on Zosyn + Vanco. ENT Dr. Jearld FentonByers has been consulted. Currently does not seem to have an abscess or drainable fluid collection. Speech evaluation, till then nothing by mouth except medications. Observe in stepdown and monitor airway closely. Continue IV fluids, lactic acid levels have stabilized.   2. Allergic reaction to clindamycin. Has received Solu-Medrol and epinephrine in the ER, no signs of lip or tongue swelling, no stridor or wheezing. Stable.   3. Nonstop tremors. Per family started after she was given latuda few months ago, he appears to have tardive dyskinesia on exam, will give trial of Cogentin IV, have requested neurology to evaluate. She is on amantadine which will be continued.   4. Encephalopathy. Likely due to combination of #1 and 2. She also has underlying undiagnosed dementia per family, at risk for delirium, she has been hallucinating at the nursing home for the last few weeks. We will do treatment as in #1 above. Minimize sedating medications and stop all unnecessary home medicines. Treat infection with antibiotics, IV fluids, avoid narcotics and benzodiazepines. Neurology to evaluate. Will obtain baseline head CT. Hold tramadol, Neurontin, did trepan, Tranexene.   5. Bipolar disorder with suicidal ideation in the past. For now will continue Lamictal, Wellbutrin and Prozac.   6. UTI. Continue Zosyn, monitor urine cultures, obtain bladder scan to rule out urinary retention.   7. Mildly elevated troponin. Appears to be due to sepsis, doubt ACS, EKG appears nonacute although very poor baseline due to tremors, will trend troponin, for now aspirin is the only option via suppository, IV beta blocker as needed.  Obtain echogram to evaluate wall motion. Monitor on telemetry.  8. Dyslipidemia. Resume statin once taking oral medications.   9. Possible calcifications noted in both carotid arteries. Obtain dedicated ultrasound. Continue aspirin and when possible statin for secondary prevention.     DVT Prophylaxis Heparin   AM Labs Ordered, also please review Full Orders  Family Communication: Admission, patients condition and plan of care including tests being ordered have been discussed with the patient and daughter and husband who indicate understanding and agree with the plan and Code Status.  Code Status DNR  Likely DC to  SNF  Condition GUARDED   Time spent in minutes : 35    Rick Carruthers K M.D on 05/27/2015 at 11:53 AM  Between 7am to 7pm - Pager - [phone]  After 7pm go to [email or web address] - password [email or web address]  [facility] - Office  [phone]     

## 2015-05-27 NOTE — ED Notes (Signed)
Bed: [id number] Expected date:  Expected time:  Means of arrival:  Comments: 69 Anaphylaxis Shock

## 2015-05-27 NOTE — Progress Notes (Signed)
ANTIBIOTIC CONSULT NOTE - INITIAL  Pharmacy Consult for Zosyn, added Vancomycin for wound infection Indication: rule out sepsis  Allergies  Allergen Reactions  . Clindamycin/Lincomycin Anaphylaxis  . Codeine Other (See Comments)    Listed on MAR  . Glipizide Other (See Comments)    Listed on MAR  . Tetracyclines & Related Other (See Comments)    [facility]   Patient Measurements: Height: 5\' 1"  (154.9 cm) Weight: 195 lb 5.2 oz (88.6 kg) IBW/kg (Calculated) : 47.8  Vital Signs: Temp: 97.9 F (36.6 C) (12/14 1600) Temp Source: Oral (12/14 1600) BP: 180/76 mmHg (12/14 1532) Pulse Rate: 126 (12/14 1532) Intake/Output from previous day:   Intake/Output from this shift: Total I/O In: 2451.3 [I.V.:2451.3] Out: 450 [Urine:450]  Labs:  Recent Labs  05/27/15 0650  WBC 32.3*  HGB 13.5  PLT 245  CREATININE 1.46*   Estimated Creatinine Clearance: 35.8 mL/min (by C-G formula based on Cr of 1.46). No results for input(s): VANCOTROUGH, VANCOPEAK, VANCORANDOM, GENTTROUGH, GENTPEAK, GENTRANDOM, TOBRATROUGH, TOBRAPEAK, TOBRARND, AMIKACINPEAK, AMIKACINTROU, AMIKACIN in the last 72 hours.   Microbiology: Recent Results (from the past 720 hour(s))  Urine culture     Status: None (Preliminary result)   Collection Time: 05/27/15 10:47 AM  Result Value Ref Range Status   Specimen Description URINE, RANDOM  Final   Special Requests   Final    NONE Clindamycin x 2 days [facility]    Culture PENDING  Incomplete   Report Status PENDING  Incomplete   Medical History: Past Medical History  Diagnosis Date  . Bipolar 1 disorder (HCC)   . Diabetes mellitus without complication (HCC)   . Hypertension   . High cholesterol   . GERD (gastroesophageal reflux disease)   . Depression   . Psychosis   . Suicide attempt by multiple drug overdose (HCC)     Medications:  Anti-infectives    Start     Dose/Rate Route Frequency Ordered Stop   05/27/15 1800   piperacillin-tazobactam (ZOSYN) IVPB 3.375 g     3.375 g 12.5 mL/hr over 240 Minutes Intravenous Every 8 hours 05/27/15 1315     05/27/15 0845  piperacillin-tazobactam (ZOSYN) IVPB 3.375 g     3.375 g 12.5 mL/hr over 240 Minutes Intravenous  Once 05/27/15 0844 05/27/15 1000     Assessment: 74 y.o. female NH resident with PMH of dementia, bipolar HTN, tremor 2/2 antipsychotics presents 05/27/2015 with possible anaphylactic reaction to Clindamycin.  Placed on Clindamycin yesterday for nonpurulent cellulitis of the neck but developed allergic reaction shortly thereafter.  Febrile with elevated WBC and LA on presentation, now improved.  Pharmacy consulted to dose Zosyn for sepsis.  Also noted w/ UTI.  PTA clindamycin >> 12/14 12/14 >> Zosyn >>    **Cx drawn after Clindamycin** 12/14 blood: IP 12/14 urine: IP  Tmax: 100.5 WBC: elevated Renal: SCr elevated; CrCl 37 CG, 39 N LA now resolved  Goal of Therapy:  Eradication of infection Appropriate antibiotic dosing for indication and renal function  Plan:  Day 2 antibiotics - add Vancomycin  Vancomycin 1500mg  x1, then 1gm q24hr Zosyn 3.375 g IV every 8 hrs by 4-hr infusion  Follow clinical course, renal function, culture results as available  Follow for de-escalation of antibiotics and LOT.  Plan to narrow as able following determination of etiology (Cellulitis vs Lemierre syndrome; also UTI)  Otho BellowsGreen, Kimberely Mccannon L PharmD Pager [phone] 05/27/2015, 4:33 PM

## 2015-05-27 NOTE — Progress Notes (Signed)
Carotid duplex attempted in the emergency department. Patient is agitated and unable to hold still even after being medicated. Will try again tomorrow 05/28/2015.

## 2015-05-27 NOTE — ED Notes (Signed)
She is markedly irritable (CNS irritability) with profound confusion and is beginning to become agitated and resistive.  Her husband is at bedside.

## 2015-05-27 NOTE — ED Notes (Signed)
Anaphylaxis to a second dose of clindamycin  Pt was given 25 mg benadryl PO at SNF. In route with EMS pt received 25 IV. Pt also received 125 mg solumedrol and .6 of Epi (2 doses 1 at 609 and 1 at 0617).  Pt has had 3 breathing treatments (15 of albuterol). Pt also received zantac prior to transport

## 2015-05-27 NOTE — ED Notes (Signed)
Daughter Alexis Thompson [phone].

## 2015-05-28 ENCOUNTER — Inpatient Hospital Stay (HOSPITAL_COMMUNITY): Payer: Medicare Other

## 2015-05-28 DIAGNOSIS — R410 Disorientation, unspecified: Secondary | ICD-10-CM | POA: Insufficient documentation

## 2015-05-28 DIAGNOSIS — G92 Toxic encephalopathy: Secondary | ICD-10-CM

## 2015-05-28 DIAGNOSIS — I6523 Occlusion and stenosis of bilateral carotid arteries: Secondary | ICD-10-CM

## 2015-05-28 DIAGNOSIS — R251 Tremor, unspecified: Secondary | ICD-10-CM

## 2015-05-28 DIAGNOSIS — I639 Cerebral infarction, unspecified: Secondary | ICD-10-CM

## 2015-05-28 DIAGNOSIS — Z22322 Carrier or suspected carrier of Methicillin resistant Staphylococcus aureus: Secondary | ICD-10-CM

## 2015-05-28 DIAGNOSIS — I509 Heart failure, unspecified: Secondary | ICD-10-CM

## 2015-05-28 LAB — BASIC METABOLIC PANEL
ANION GAP: 9 (ref 5–15)
BUN: 28 mg/dL — ABNORMAL HIGH (ref 6–20)
CALCIUM: 8.8 mg/dL — AB (ref 8.9–10.3)
CO2: 20 mmol/L — AB (ref 22–32)
Chloride: 117 mmol/L — ABNORMAL HIGH (ref 101–111)
Creatinine, Ser: 1.09 mg/dL — ABNORMAL HIGH (ref 0.44–1.00)
GFR, EST AFRICAN AMERICAN: 58 mL/min — AB (ref >60)
GFR, EST NON AFRICAN AMERICAN: 50 mL/min — AB (ref >60)
GLUCOSE: 113 mg/dL — AB (ref 65–99)
POTASSIUM: 4 mmol/L (ref 3.5–5.1)
Sodium: 146 mmol/L — ABNORMAL HIGH (ref 135–145)

## 2015-05-28 LAB — CBC
HEMATOCRIT: 32.4 % — AB (ref 36.0–46.0)
HEMOGLOBIN: 10.4 g/dL — AB (ref 12.0–15.0)
MCH: 30.9 pg (ref 26.0–34.0)
MCHC: 32.1 g/dL (ref 30.0–36.0)
MCV: 96.1 fL (ref 78.0–100.0)
Platelets: 191 10*3/uL (ref 150–400)
RBC: 3.37 MIL/uL — AB (ref 3.87–5.11)
RDW: 16.4 % — ABNORMAL HIGH (ref 11.5–15.5)
WBC: 19.4 10*3/uL — ABNORMAL HIGH (ref 4.0–10.5)

## 2015-05-28 LAB — TROPONIN I: TROPONIN I: 0.07 ng/mL — AB (ref <0.031)

## 2015-05-28 LAB — VITAMIN B12: VITAMIN B 12: 487 pg/mL (ref 180–914)

## 2015-05-28 LAB — TSH: TSH: 0.591 u[IU]/mL (ref 0.350–4.500)

## 2015-05-28 MED ORDER — DIPHENHYDRAMINE HCL 50 MG/ML IJ SOLN
25.0000 mg | Freq: Once | INTRAMUSCULAR | Status: AC
  Administered 2015-05-28: 25 mg via INTRAVENOUS
  Filled 2015-05-28: qty 1

## 2015-05-28 MED ORDER — LORAZEPAM 2 MG/ML IJ SOLN
1.0000 mg | Freq: Once | INTRAMUSCULAR | Status: AC
  Administered 2015-05-28: 1 mg via INTRAVENOUS
  Filled 2015-05-28: qty 1

## 2015-05-28 MED ORDER — GABAPENTIN 100 MG PO CAPS: 200.0000 mg | ORAL_CAPSULE | Freq: Two times a day (BID) | ORAL | Status: DC

## 2015-05-28 NOTE — Progress Notes (Signed)
[facility] PROGRESS NOTE  Alexis Thompson YQM:[id number] DOB: 19-Aug-1940 DOA: 05/27/2015 PCP: No PCP Per Patient  Assessment/Plan: 1. Sepsis due to soft tissue neck infection/parotiditis.CT Neck noted, blood cultures pending, had allergic reaction to clindamycin at SNF. -Will continue Zosyn + Vanco.  -ENT Dr. Jearld FentonByers has seen patient and recommended warm compresses to affected area; no further intervention anticipated -will follow SPL evaluation and rec's  2. Allergic reaction to clindamycin. Has received Solu-Medrol and epinephrine in the ER, no signs of lip or tongue swelling, no stridor or wheezing. Stable. -will monitor   3. Nonstop tremors. Per family started after she was given latuda few months ago, She appears to have tardive dyskinesia on exam -trial of cogentin and neurontin initiated -after discussing with neurology plan is to use neurontin alone -dose adjusted to minimize sedative effect  4. Encephalopathy. Likely due to combination of #1 and She also has underlying dementia per family, which make her at risk for delirium, she has been hallucinating at the nursing home for the last few weeks.  -will Hold tramadol, Neurontin, did trepan, Tranexene. -will minimize narcotics and other sedatives -continue treatment of infection  -will add B12 and RPR -CT head neg for acute abnormalities  5. Bipolar disorder with suicidal ideation in the past. For now will continue Lamictal, Wellbutrin and Prozac (patient with mild difficulty taking PO, due to somnolence state)  6. UTI. On Zosyn -will discontinue foley and assess for retention -follow cx data  7. Mildly elevated troponin. Appears to be due to sepsis, doubt ACS, EKG appears nonacute although very poor baseline due to tremors. -troponin with flat elevation -2-D echo pending -will monitor and follow results -continue ASA and statins (when able to tolerate PO)  8. Dyslipidemia. Plan is to resume statin once taking oral  medications.  9. 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 Vertebral artery flow is antegrade.   2-D echo:  Antibiotics:  Vancomycin and zosyn 12/14  HPI/Subjective: Afebrile, with decrease swelling in her her neck (according to daughter). Patient is lethargic and calmer today   Objective: Filed Vitals:   05/28/15 0600 05/28/15 0800  BP: 126/43 140/55  Pulse: 51 52  Temp:    Resp: 20 18    Intake/Output Summary (Last 24 hours) at 05/28/15 1014 Last data filed at 05/28/15 0700  Gross per 24 hour  Intake 4196.25 ml  Output   1250 ml  Net 2946.25 ml   Filed Weights   05/27/15 0648 05/27/15 1400 05/28/15 0313  Weight: 97.07 kg (214 lb) 88.6 kg (195 lb 5.2 oz) 89.1 kg (196 lb 6.9 oz)    Exam:   General:  Currently has remained afebrile, she is calmer today, but lethargic   Cardiovascular: S1 and s2, no rubs or gallops  Respiratory: CTA bilaterally  Abdomen: soft, NT, ND, positive BS  Musculoskeletal: no edema, no cyanosis   Data Reviewed: Basic Metabolic Panel:  Recent Labs Lab 05/27/15 0650 05/28/15 0145  NA 142 146*  K 3.8 4.0  CL 107 117*  CO2 15* 20*  GLUCOSE 192* 113*  BUN 40* 28*  CREATININE 1.46* 1.09*  CALCIUM 10.2 8.8*   Liver Function Tests:  Recent Labs Lab 05/27/15 0650  AST 20  ALT 19  ALKPHOS 75  BILITOT 1.1  PROT 7.6  ALBUMIN 4.2   CBC:  Recent Labs Lab 05/27/15 0650 05/28/15 0145  WBC 32.3* 19.4*  NEUTROABS 24.9*  --   HGB 13.5 10.4*  HCT 40.5 32.4*  MCV 93.3 96.1  PLT 245 191    Cardiac Enzymes:  Recent Labs Lab 05/27/15 0650 05/27/15 1204 05/27/15 1914 05/28/15 0145  TROPONINI 0.11* 0.10* 0.08* 0.07*    Recent Results (from the past 240 hour(s))  Urine culture     Status: None (Preliminary result)   Collection Time: 05/27/15 10:47 AM  Result Value Ref Range Status   Specimen Description URINE, RANDOM  Final   Special Requests   Final    NONE Clindamycin x 2 days Performed at [facility]    Culture PENDING  Incomplete   Report Status PENDING  Incomplete  MRSA PCR Screening     Status: Abnormal   Collection Time: 05/27/15  2:30 PM  Result Value Ref Range Status   MRSA by PCR POSITIVE (A) NEGATIVE Final    Comment:        The GeneXpert MRSA Assay (FDA approved for NASAL specimens only), is one component of a comprehensive MRSA colonization surveillance program. It is not intended to diagnose MRSA infection nor to guide or monitor treatment for MRSA infections. RESULT CALLED TO, READ BACK BY AND VERIFIED WITH: Laqueta Jean RN AT 1725 ON 05/27/15 BY INGLEE      Studies: Ct Head Wo Contrast  05/27/2015  CLINICAL DATA:  74 year old female with profound confusion, agitation. Initial encounter. EXAM: CT HEAD WITHOUT CONTRAST TECHNIQUE: Contiguous axial images were obtained from the base of the skull through the vertex without intravenous contrast. COMPARISON:  Neck CT without contrast 0827 hours today. Head CT 05/12/2015. FINDINGS: Visualized paranasal sinuses and mastoids are clear. No acute osseous abnormality identified. Mild face and scalp edema about the base of the skull. Orbits soft tissues remain normal. Calcified atherosclerosis at the skull base.+++ confluent bilateral cerebral white matter hypodensity appears stable. No midline shift, mass effect, or evidence of intracranial mass lesion. No ventriculomegaly. No acute intracranial hemorrhage identified. No suspicious intracranial vascular hyperdensity. IMPRESSION: No acute intracranial  abnormality. Stable noncontrast CT appearance of the brain with advanced nonspecific white matter changes. Electronically Signed   By: Odessa Fleming M.D.   On: 05/27/2015 13:40   Ct Soft Tissue Neck Wo Contrast  05/27/2015  CLINICAL DATA:  74 year old female with anaphylaxis and neck swelling EXAM: CT NECK WITHOUT CONTRAST TECHNIQUE: Multidetector CT imaging of the neck was performed following the standard protocol without intravenous contrast. COMPARISON:  Prior CT scan of the head 05/12/2015 prior CT scan of the cervical spine 02/24/2014 FINDINGS: Visualized intracranial contents are unremarkable save for stable cerebral atrophy. The globes and orbits are symmetric and within normal limits bilaterally. Mild reticulation of the subcutaneous fat overlying both parotid glands and extending down to the level of the mandible. Additionally, there is hypo density within the slightly enlarged tongue suggesting the presence of edema. Normal aeration of mastoid air cells and visualized paranasal sinuses. The nasopharyngeal and oro pharyngeal airways remain patent. The epiglottis is well-defined and not particularly edematous. Perhaps mild edema of the aryepiglottic folds. The subglottic airway is patent. The visualized lung apices are within normal limits. 2 vessel aortic arch. The brachiocephalic  and left common carotid artery share a common origin. The great vessels are highly tortuous. The thyroid gland and thoracic inlet are unremarkable. No acute osseous abnormality. Atherosclerotic calcifications are present in the right and left carotid bifurcations. IMPRESSION: 1. No significant narrowing of the airway. 2. Reticulation of the subcutaneous fat of the bilateral cheeks extending from the temporal fossa to the mandible consistent with edema. 3. Suggestion of mild edema of the tongue and aryepiglottic folds. 4. Calcifications in the bilateral carotid artery bifurcations. Consider further evaluation with dedicated carotid  ultrasound to assess for underlying stenosis. Electronically Signed   By: Malachy Moan M.D.   On: 05/27/2015 09:03   Dg Chest Port 1 View  05/27/2015  CLINICAL DATA:  Shortness of breath today. Hypertension. Nonsmoker. EXAM: PORTABLE CHEST 1 VIEW COMPARISON:  05/12/2015; 09/03/2013; 01/22/2013 FINDINGS: Grossly unchanged cardiac silhouette and mediastinal contours given reduced lung volumes. Atherosclerotic plaque within the thoracic aorta. Worsening bilateral infrahilar heterogeneous slightly nodular interstitial opacities. No pleural effusion or pneumothorax. No evidence of edema. No acute osseus abnormalities. IMPRESSION: 1. No definite acute cardiopulmonary disease on this hypoventilated AP portable examination. 2. Decreased lung volumes with worsening bilateral infrahilar opacities favored to represent atelectasis. Electronically Signed   By: Simonne Come M.D.   On: 05/27/2015 07:27    Scheduled Meds: . amantadine  100 mg Oral BID  . antiseptic oral rinse  7 mL Mouth Rinse q12n4p  . aspirin EC  81 mg Oral Daily  . aspirin  150 mg Rectal Daily  . buPROPion  150 mg Oral Daily  . chlorhexidine  15 mL Mouth Rinse BID  . Chlorhexidine Gluconate Cloth  6 each Topical Q0600  . FLUoxetine  20 mg Oral Daily  . [START ON 05/29/2015] gabapentin  200 mg Oral BID  . heparin  5,000 Units Subcutaneous 3 times per day  . lamoTRIgine  200 mg Oral Daily  . metoprolol tartrate  12.5 mg Oral BID  . mupirocin ointment  1 application Nasal BID  . piperacillin-tazobactam (ZOSYN)  IV  3.375 g Intravenous Q8H  . sodium chloride  3 mL Intravenous Q12H  . vancomycin  1,000 mg Intravenous Q24H   Continuous Infusions: . sodium chloride 75 mL/hr at 05/28/15 0105    Principal Problem:   Sepsis (HCC) Active Problems:   Bipolar I disorder (HCC)   Essential hypertension   GERD (gastroesophageal reflux disease)   Cellulitis, neck   Tremor    Time spent: 35 minutes    Vassie Loll  [facility] Pager [phone]. If 7PM-7AM, please contact night-coverage at [email or web address], password [facility] 05/28/2015, 10:14 AM  LOS: 1 day

## 2015-05-28 NOTE — Care Management Note (Signed)
Case Management Note  Patient Details  Name: Alexis Thompson MRN: [id number] Date of Birth: Feb 07, 1941  Subjective/Objective:             Hypotension and hypovolemia       Action/Plan: Date: May 28, 2015 Chart reviewed for concurrent status and case management needs. Will continue to follow patient for changes and needs: Marcelle Smilinghonda Vasilis Luhman, RN, BSN, [place]   [phone]  Expected Discharge Date:   (UNKNOWN)               Expected Discharge Plan:  Home/Self Care  In-House Referral:  NA  Discharge planning Services  CM Consult  Post Acute Care Choice:  NA Choice offered to:  NA  DME Arranged:  N/A DME Agency:  NA  HH Arranged:  NA HH Agency:  NA  Status of Service:  In process, will continue to follow  Medicare Important Message Given:    Date Medicare IM Given:    Medicare IM give by:    Date Additional Medicare IM Given:    Additional Medicare Important Message give by:     If discussed at Long Length of Stay Meetings, dates discussed:    Additional Comments:  Golda AcreDavis, Kyree Fedorko Lynn, RN 05/28/2015, 4:09 PM

## 2015-05-28 NOTE — Progress Notes (Signed)
Echocardiogram 2D Echocardiogram has been performed.  Alexis BasemanReel, [name] 05/28/2015, 10:01 AM

## 2015-05-28 NOTE — NC FL2 (Signed)
MEDICAID FL2 LEVEL OF CARE SCREENING TOOL     IDENTIFICATION  Patient Name: Alexis Thompson Birthdate: [birth year] Sex: female Admission Date (Current Location): 05/27/2015  [facility] and [place] Number:     Facility and Address:  [facility],  [address], [place] [address]      Provider Number: [id number]  Attending Physician Name and Address:  Vassie Loll, MD  Relative Name and Phone Number:       Current Level of Care: Hospital Recommended Level of Care: Skilled Nursing Facility Prior Approval Number:    Date Approved/Denied:   PASRR Number:    Discharge Plan: SNF    Current Diagnoses: Patient Active Problem List   Diagnosis Date Noted  . Confusion   . Cellulitis, neck 05/27/2015  . Sepsis (HCC) 05/27/2015  . Tremor 05/27/2015  . Allergic reaction   . Bipolar I disorder (HCC) 04/27/2015  . Drug induced akathisia 04/27/2015  . Essential hypertension 04/27/2015  . Anxiety 04/27/2015  . Hyperlipidemia 04/27/2015  . Neuropathy (HCC) 04/27/2015  . Insomnia 04/27/2015  . GERD (gastroesophageal reflux disease) 04/27/2015  . Overactive bladder 04/27/2015  . Right hand/wrist pain 04/27/2015    Orientation RESPIRATION BLADDER Height & Weight    Self  O2 Incontinent, Indwelling catheter  (154.9 cm) 196 lbs.  BEHAVIORAL SYMPTOMS/MOOD NEUROLOGICAL BOWEL NUTRITION STATUS  Other (Comment) (no behaviors)   Incontinent Diet (NPO - expect pt to progress- SLP ordered)  AMBULATORY STATUS COMMUNICATION OF NEEDS Skin   Extensive Assist Verbally Normal                       Personal Care Assistance Level of Assistance  Bathing, Dressing Bathing Assistance: Maximum assistance   Dressing Assistance: Maximum assistance     Functional Limitations Info  Sight, Hearing, Speech Sight Info: Adequate Hearing Info: Adequate Speech Info: Adequate    SPECIAL CARE FACTORS FREQUENCY                       Contractures       Additional Factors Info  Code Status, Psychotropic, Isolation Precautions Code Status Info: DNR       Isolation Precautions Info: 05/27/15 + MRSA pcr- no encounter.      Current Medications (05/28/2015):  This is the current hospital active medication list Current Facility-Administered Medications  Medication Dose Route Frequency Provider Last Rate Last Dose  . amantadine (SYMMETREL) capsule 100 mg  100 mg Oral BID Leroy Sea, MD   100 mg at 05/27/15 2206  . antiseptic oral rinse (CPC / CETYLPYRIDINIUM CHLORIDE 0.05%) solution 7 mL  7 mL Mouth Rinse q12n4p Leroy Sea, MD   7 mL at 05/28/15 1200  . aspirin EC tablet 81 mg  81 mg Oral Daily Leroy Sea, MD   81 mg at 05/27/15 1623  . aspirin suppository 150 mg  150 mg Rectal Daily Leroy Sea, MD   150 mg at 05/28/15 8119  . buPROPion (WELLBUTRIN XL) 24 hr tablet 150 mg  150 mg Oral Daily Leroy Sea, MD   150 mg at 05/27/15 1624  . chlorhexidine (PERIDEX) 0.12 % solution 15 mL  15 mL Mouth Rinse BID Leroy Sea, MD   15 mL at 05/28/15 1478  . Chlorhexidine Gluconate Cloth 2 % PADS 6 each  6 each Topical Q0600 Leroy Sea, MD   6 each at 05/28/15 1124  . FLUoxetine (PROZAC) capsule 20 mg  20 mg Oral Daily Leroy SeaPrashant K Singh, MD   20 mg at 05/27/15 1622  . [START ON 05/29/2015] gabapentin (NEURONTIN) capsule 200 mg  200 mg Oral BID Vassie Lollarlos Madera, MD      . guaiFENesin-dextromethorphan (ROBITUSSIN DM) 100-10 MG/5ML syrup 5 mL  5 mL Oral Q4H PRN Leroy SeaPrashant K Singh, MD      . heparin injection 5,000 Units  5,000 Units Subcutaneous 3 times per day Leroy SeaPrashant K Singh, MD   5,000 Units at 05/28/15 1410  . hydrALAZINE (APRESOLINE) injection 10 mg  10 mg Intravenous Q30 min PRN Rolland PorterMark James, MD      . lamoTRIgine (LAMICTAL) tablet 200 mg  200 mg Oral Daily Leroy SeaPrashant K Singh, MD   200 mg at 05/27/15 1625  . metoprolol (LOPRESSOR) injection 5 mg  5 mg Intravenous Q4H PRN Leroy SeaPrashant K Singh, MD   5 mg at 05/28/15 1410  .  metoprolol tartrate (LOPRESSOR) tablet 12.5 mg  12.5 mg Oral BID Leroy SeaPrashant K Singh, MD   12.5 mg at 05/27/15 2204  . morphine 2 MG/ML injection 1 mg  1 mg Intravenous Q3H PRN Leroy SeaPrashant K Singh, MD      . mupirocin ointment (BACTROBAN) 2 % 1 application  1 application Nasal BID Leroy SeaPrashant K Singh, MD   1 application at 05/28/15 [phone]  . ondansetron (ZOFRAN) tablet 4 mg  4 mg Oral Q6H PRN Leroy SeaPrashant K Singh, MD       Or  . ondansetron (ZOFRAN) injection 4 mg  4 mg Intravenous Q6H PRN Leroy SeaPrashant K Singh, MD      . piperacillin-tazobactam (ZOSYN) IVPB 3.375 g  3.375 g Intravenous Q8H Drew A Wofford, RPH   3.375 g at 05/28/15 [id number]  . sodium chloride 0.9 % injection 3 mL  3 mL Intravenous Q12H Leroy SeaPrashant K Singh, MD   3 mL at 05/27/15 2220  . vancomycin (VANCOCIN) IVPB 1000 mg/200 mL premix  1,000 mg Intravenous Q24H Otho Bellowserri L Green, [facility] HospitalRPH         Discharge Medications: Please see discharge summary for a list of discharge medications.  Relevant Imaging Results:  Relevant Lab Results:   Additional Information    Luqman Perrelli, Dickey GaveJamie [name], LCSW

## 2015-05-28 NOTE — Progress Notes (Signed)
VASCULAR LAB PRELIMINARY  PRELIMINARY  PRELIMINARY  PRELIMINARY  Carotid duplex completed.    Preliminary report:  .Bilateral:  1-39% ICA stenosis.  Vertebral artery flow is antegrade.    Jia Dottavio, RVS 05/28/2015, 8:54 AM

## 2015-05-28 NOTE — Progress Notes (Signed)
Subjective: More sedated  Exam: Filed Vitals:   05/28/15 0600 05/28/15 0800  BP: 126/43 140/55  Pulse: 51 52  Temp:    Resp: 20 18   Gen: In bed, NAD Resp: non-labored breathing, no acute distress HEENT: still tender over the parotids.   Neuro: MS: Awakens to voice,  UJ:WJXBCN:EOMI Motor: MAEW, multiple spontaneous movements, appears to be Dominicaakithisia Sensory: intact to LT  Pertinent Labs: leukocytosis  Impression: 74 yo F with a history of abnormal movements for 6 weeks and altered metnal status. Cogentin can certainly worsen mental status in someone with dementia(for which there are legitimate concerns but no firm diagnosis) and therefore I would favor stopping this pending improvement in mental status. Gabapentin can be helpful for akisthisia, but is also sedating and would reduce dose.    Her mental status is likley somewhat related to her infection as well as she has a history of delirium with UTI.  Recommendations: 1) Stop cogentin 2) decrease gabapentin to 200mg  TID  Ritta SlotMcNeill Tani Virgo, MD [facility] [phone]  If 7pm- 7am, please page neurology on call as listed in AMION.

## 2015-05-29 ENCOUNTER — Encounter: Payer: Self-pay | Admitting: *Deleted

## 2015-05-29 ENCOUNTER — Inpatient Hospital Stay (HOSPITAL_COMMUNITY): Payer: Medicare Other

## 2015-05-29 DIAGNOSIS — N179 Acute kidney failure, unspecified: Secondary | ICD-10-CM

## 2015-05-29 DIAGNOSIS — R7989 Other specified abnormal findings of blood chemistry: Secondary | ICD-10-CM

## 2015-05-29 DIAGNOSIS — N390 Urinary tract infection, site not specified: Secondary | ICD-10-CM

## 2015-05-29 DIAGNOSIS — K219 Gastro-esophageal reflux disease without esophagitis: Secondary | ICD-10-CM

## 2015-05-29 LAB — HEMOGLOBIN A1C
HEMOGLOBIN A1C: 6.3 % — AB (ref 4.8–5.6)
Mean Plasma Glucose: 134 mg/dL

## 2015-05-29 LAB — RPR: RPR Ser Ql: NONREACTIVE

## 2015-05-29 LAB — URINE CULTURE: Culture: >=100000

## 2015-05-29 MED ORDER — METOPROLOL TARTRATE 1 MG/ML IV SOLN
5.0000 mg | Freq: Three times a day (TID) | INTRAVENOUS | Status: DC
  Administered 2015-05-29 – 2015-05-31 (×7): 5 mg via INTRAVENOUS
  Filled 2015-05-29 (×7): qty 5

## 2015-05-29 MED ORDER — GABAPENTIN 100 MG PO CAPS
200.0000 mg | ORAL_CAPSULE | Freq: Three times a day (TID) | ORAL | Status: DC
  Administered 2015-05-29 – 2015-06-03 (×12): 200 mg via ORAL
  Filled 2015-05-29 (×15): qty 2

## 2015-05-29 MED ORDER — SIMVASTATIN 10 MG PO TABS
20.0000 mg | ORAL_TABLET | Freq: Every day | ORAL | Status: DC
  Administered 2015-05-30 – 2015-06-02 (×4): 20 mg via ORAL
  Filled 2015-05-29 (×4): qty 2

## 2015-05-29 MED ORDER — HALOPERIDOL LACTATE 5 MG/ML IJ SOLN
5.0000 mg | Freq: Once | INTRAMUSCULAR | Status: AC
Start: 2015-05-29 — End: 2015-05-29
  Administered 2015-05-29: 5 mg via INTRAVENOUS
  Filled 2015-05-29: qty 1

## 2015-05-29 MED ORDER — DEXTROSE-NACL 5-0.45 % IV SOLN
INTRAVENOUS | Status: DC
  Administered 2015-05-29: 13:00:00 via INTRAVENOUS
  Administered 2015-05-30: 1000 mL via INTRAVENOUS

## 2015-05-29 MED ORDER — HYDRALAZINE HCL 20 MG/ML IJ SOLN: 10.0000 mg | Freq: Three times a day (TID) | INTRAMUSCULAR | Status: DC | PRN

## 2015-05-29 NOTE — Clinical Social Work Note (Signed)
Clinical Social Work Assessment  Patient Details  Name: Alexis Thompson MRN: [id number] Date of Birth: February 01, 1941  Date of referral:  05/29/15               Reason for consult:  Facility Placement, Discharge Planning                Permission sought to share information with:  Facility Industrial/product designerContact Representative Permission granted to share information::  Yes, Verbal Permission Granted  Name::        Agency::     Relationship::     Contact Information:     Housing/Transportation Living arrangements for the past 2 months:  Skilled Nursing Facility Source of Information:  Adult Children Patient Interpreter Needed:  None Criminal Activity/Legal Involvement Pertinent to Current Situation/Hospitalization:  No - Comment as needed Significant Relationships:  Adult Children Lives with:  Facility Resident Do you feel safe going back to the place where you live?    Need for family participation in patient care:  Yes (Comment)  Care giving concerns:  SNF placement continues to be needed.   Social Worker assessment / plan:  [facility] with sepsis. Pt is oriented x 1 and is unable to participate in d/c planning. Pt sleeping during visit. Pt's niece directed CSW to speak with pt's daughter, Anthony SarDebbie Phillips ( [phone] ) for assistance with d/c planning. Daughter contacted and reports that she would like [facility]. Pt has been paying out of pocket while waiting for medicaid approval. Pt has [employer] insurance. If appropriate, PT will eval and make recommendations. If Skilled rehab is needed CSW will request assistance with coverage, at [place] , from [employer]. [facility] has been contacted and is willing to readmit pt when stable. CSW will continue to follow to assist with d/c planning to SNF.  Employment status:  Retired Database administratornsurance information:  Managed Medicare PT Recommendations:  Not assessed at this time Information / Referral to  community resources:  Skilled Nursing Facility  Patient/Family's Response to care:  [facility]  Patient/Family's Understanding of and Emotional Response to Diagnosis, Current Treatment, and Prognosis:  Daughter is aware of pt's medical status. [facility]  Emotional Assessment Appearance:  Appears stated age Attitude/Demeanor/Rapport:  Unable to Assess Affect (typically observed):  Unable to Assess Orientation:  Oriented to Self Alcohol / Substance use:    Psych involvement (Current and /or in the community):  No (Comment)  Discharge Needs  Concerns to be addressed:  Discharge Planning Concerns Readmission within the last 30 days:  No Current discharge risk:  None Barriers to Discharge:  No Barriers Identified   Royetta AsalHaidinger, Torrance Frech [name], LCSW  [phone] 05/29/2015, 9:59 AM

## 2015-05-29 NOTE — Evaluation (Signed)
Clinical/Bedside Swallow Evaluation Patient Details  Name: Alexis Thompson MRN: [id number] Date of Birth: 1940-10-07  Today's Date: 05/29/2015 Time: SLP Start Time (ACUTE ONLY): 0955 SLP Stop Time (ACUTE ONLY): 1027 SLP Time Calculation (min) (ACUTE ONLY): 32 min  Past Medical History:  Past Medical History  Diagnosis Date  . Bipolar 1 disorder (HCC)   . Diabetes mellitus without complication (HCC)   . Hypertension   . High cholesterol   . GERD (gastroesophageal reflux disease)   . Depression   . Psychosis   . Suicide attempt by multiple drug overdose [facility])    Past Surgical History: History reviewed. No pertinent past surgical history. HPI:  74 yo female adm to [facility] with cellulitis due to allergy to clindamycin, PMH + for bipolar disorder, akathisia - medicine induced.  CT neck showed mild lingual and aryepiglottic fold edema.  Pt CXR negative and CT head negative.  She did have a CXR 05/02/15 for fever/sore throat that was also negative.  Swallow evaluation ordered.    Assessment / Plan / Recommendation Clinical Impression  Pt presents with symptoms concerning for pharyngeal dysphagia characterized by cough immediate post swallow.  Suspect pt's edema may be impacting her airway protection with po intake.  Daughter present and reports pt with dysphagia x 2 weeks prior to admit requiring diet change to dys2/thin and SLP input.   Dyskinesia and ? retained secretions in pharynx (per pt sensation) also contributes to pt's dysphagia/aspiration risk.  MBS recommended to determine if pt can tolerate any intake, pt and daughter agreeable.  Using teach back, educated daughter/pt to findings/importance of oral care and plan.  Plan for MBS @ approximately 1130 am.     Aspiration Risk  Risk for inadequate nutrition/hydration;Moderate aspiration risk    Diet Recommendation NPO        Other  Recommendations     Follow up Recommendations    TBD after MBS    Frequency and Duration             Prognosis Prognosis for Safe Diet Advancement: Guarded Barriers to Reach Goals: Cognitive deficits;Severity of deficits      Swallow Study   General Date of Onset: 05/29/15 HPI: 74 yo female adm to [facility] with cellulitis due to allergy to clindamycin, PMH + for bipolar disorder, akathisia - medicine induced.  CT neck showed mild lingual and aryepiglottic fold edema.  Pt CXR negative and CT head negative.  She did have a CXR 05/02/15 for fever/sore throat that was also negative.  Swallow evaluation ordered.  Type of Study: Bedside Swallow Evaluation Diet Prior to this Study: NPO Temperature Spikes Noted: No Respiratory Status: Room air History of Recent Intubation: No Behavior/Cognition: Alert;Confused;Distractible;Requires cueing;Doesn't follow directions Oral Cavity Assessment: Dry;Erythema Oral Care Completed by SLP: Yes Oral Cavity - Dentition: Adequate natural dentition Vision: Functional for self-feeding Self-Feeding Abilities: Needs assist;Total assist Patient Positioning: Upright in bed Baseline Vocal Quality: Aphonic Volitional Cough: Weak Volitional Swallow: Unable to elicit    Oral/Motor/Sensory Function Overall Oral Motor/Sensory Function: Generalized oral weakness   Ice Chips Ice chips: Impaired Presentation: Spoon Oral Phase Impairments: Reduced lingual movement/coordination;Impaired mastication;Reduced labial seal Oral Phase Functional Implications: Prolonged oral transit Pharyngeal Phase Impairments: Suspected delayed Swallow;Cough - Delayed   Thin Liquid Thin Liquid: Impaired Presentation: Spoon Oral Phase Impairments: Reduced lingual movement/coordination Oral Phase Functional Implications: Prolonged oral transit Pharyngeal  Phase Impairments: Cough - Immediate    Nectar Thick Nectar Thick Liquid: Not tested   Honey Thick Honey Thick Liquid: Not  tested   Puree Puree: Impaired Presentation: Spoon Oral Phase Impairments: Reduced lingual  movement/coordination Oral Phase Functional Implications: Prolonged oral transit Pharyngeal Phase Impairments: Suspected delayed Swallow;Cough - Immediate   Solid Solid: Not tested       Donavan Burnetamara Jahlil Ziller, MS [facility] SLP [phone]

## 2015-05-29 NOTE — Progress Notes (Signed)
[facility] PROGRESS NOTE  Alexis Thompson ZOX:[id number] DOB: Nov 26, 1940 DOA: 05/27/2015 PCP: No PCP Per Patient  Assessment/Plan: 1. Sepsis due to soft tissue neck infection/parotiditis. CT Neck noted, blood cultures (no growth up to date), had allergic reaction to clindamycin at SNF. -Will continue Zosyn + Vanco.  -ENT Dr. Jearld Fenton has seen patient and recommended warm compresses to affected area; no further intervention anticipated at this point -improving with antibiotics -will follow SPL evaluation and rec's -slowly improving -will monitor on stepdown due to high psychological features and high risk for decompensation   2. Allergic reaction to clindamycin. Has received Solu-Medrol and epinephrine in the ER, no signs of lip or tongue swelling, no stridor or wheezing. Stable. -will monitor  -patient with some hoarseness; but improving according to daughter   3. Nonstop tremors. Per family started after she was given latuda few months ago, She appears to have tardive dyskinesia/akathisia on exam -trial of cogentin and neurontin initiated on admission -after discussing with neurology plan is to use neurontin alone; dose adjusted to  TID trying to  minimize sedative effect -continue use of amantadine   4. Encephalopathy. Likely due to combination of #1 and She also has underlying dementia per family, which make her at risk for delirium, she has been hallucinating at the nursing home for the last few weeks.  -will Hold tramadol, Neurontin, didtrepan, Tranexene. -will minimize narcotics and other sedatives -continue treatment of infection  -B12 and RPR WNL and neg respectively  -CT head neg for acute abnormalities   5. Bipolar disorder with suicidal ideation in the past. For now will continue Lamictal, Wellbutrin and Prozac (patient with mild difficulty taking PO, due to somnolence state and now concerns of swallowing difficulty) -SPL order in place to evaluate   6. Enterococcus  and Pseudomonas UTI: On vancomycin and Zosyn -will discontinue foley and assess for retention -follow cx data to narrow antibiotics -if unable to void, will replace foley  7. Mildly elevated troponin. Appears to be due to sepsis, doubt ACS, EKG appears nonacute although very poor baseline due to tremors. -troponin with flat elevation and trending down -2-D echo w/o acute wall motion abnormalities and preserved EF -continue ASA and statins (when able to tolerate PO)  8. Dyslipidemia. Plan is to resume statin once taking oral medications.  9. 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 use chlorhexidine and mupirocin -patient is on contact precaution    11-AKI: due to dehydration and pre-renal azotemia -will continue IVF's and follow trend  12-hypernatremia: mild at 146 range -will change IVF's to D51/2 NS -follow electrolytes in am   Code Status: DNR Family Communication: daughter at bedside  Disposition Plan: will monitor for another 24 hours in stepdown due to psychologic demands; will follow SPL rec's for safety with swallowing and advance diet   Consultants:  Neurology  ENT  Procedures:  See below for x-ray reports   Carotid duplex: Bilateral: 1-39% ICA stenosis. Vertebral artery flow is antegrade.   2-D echo: - Left ventricle: The cavity size was normal. Wall thickness was increased in a pattern of mild LVH. Systolic function was normal. The estimated ejection fraction was in the range of 55% to 60%. Wall motion was normal; there were no regional wall motion abnormalities. Left ventricular diastolic function parameters were normal. - Atrial septum: No defect or patent foramen ovale was identified. - Pulmonary arteries: PA peak pressure: 40 mm Hg (S).  Antibiotics:  Vancomycin  and zosyn 12/14  HPI/Subjective: Afebrile, able to follow simple commands and was oriented to person and  able to recognize family member at bedside. Right side of her neck and parotid gland swelling continue improving; no fever and HR is controlled. Still with visible tremors (even better) and restless.   Objective: Filed Vitals:   05/29/15 0746 05/29/15 0859  BP:  182/73  Pulse:    Temp: 98.7 F (37.1 C)   Resp:      Intake/Output Summary (Last 24 hours) at 05/29/15 0956 Last data filed at 05/29/15 0600  Gross per 24 hour  Intake    420 ml  Output      0 ml  Net    420 ml   Filed Weights   05/27/15 0648 05/27/15 1400 05/28/15 0313  Weight: 97.07 kg (214 lb) 88.6 kg (195 lb 5.2 oz) 89.1 kg (196 lb 6.9 oz)    Exam:   General:  Currently has remained afebrile, she is restless and oriented X 1-2 (person and able to recognize family member at bedside by name.    Cardiovascular: S1 and s2, no rubs or gallops  Respiratory: CTA bilaterally  Abdomen: soft, NT, ND, positive BS  Musculoskeletal: no edema, no cyanosis   Skin: with right side of her neck still with erythema and swelling; tender to palpation   Data Reviewed: Basic Metabolic Panel:  Recent Labs Lab 05/27/15 0650 05/28/15 0145  NA 142 146*  K 3.8 4.0  CL 107 117*  CO2 15* 20*  GLUCOSE 192* 113*  BUN 40* 28*  CREATININE 1.46* 1.09*  CALCIUM 10.2 8.8*   Liver Function Tests:  Recent Labs Lab 05/27/15 0650  AST 20  ALT 19  ALKPHOS 75  BILITOT 1.1  PROT 7.6  ALBUMIN 4.2   CBC:  Recent Labs Lab 05/27/15 0650 05/28/15 0145  WBC 32.3* 19.4*  NEUTROABS 24.9*  --   HGB 13.5 10.4*  HCT 40.5 32.4*  MCV 93.3 96.1  PLT 245 191   Cardiac Enzymes:  Recent Labs Lab 05/27/15 0650 05/27/15 1204 05/27/15 1914 05/28/15 0145  TROPONINI 0.11* 0.10* 0.08* 0.07*    Recent Results (from the past 240 hour(s))  Blood culture (routine x 2)     Status: None (Preliminary result)   Collection Time: 05/27/15  6:50 AM  Result Value Ref Range Status   Specimen Description BLOOD RIGHT FOREARM  Final    Special Requests BOTTLES DRAWN AEROBIC AND ANAEROBIC  Final   Culture   Final    NO GROWTH 1 DAY Performed at [facility]    Report Status PENDING  Incomplete  Blood culture (routine x 2)     Status: None (Preliminary result)   Collection Time: 05/27/15  7:16 AM  Result Value Ref Range Status   Specimen Description BLOOD RIGHT HAND  Final   Special Requests BOTTLES DRAWN AEROBIC AND ANAEROBIC  Final   Culture   Final    NO GROWTH 1 DAY Performed at [facility]    Report Status PENDING  Incomplete  Urine culture     Status: None   Collection Time: 05/27/15 10:47 AM  Result Value Ref Range Status   Specimen Description URINE, RANDOM  Final   Special Requests NONE Clindamycin x 2 days  Final   Culture   Final    >=100,000 COLONIES/mL ENTEROCOCCUS SPECIES 60,000 COLONIES/ml PSEUDOMONAS AERUGINOSA Performed at [facility]    Report Status 05/29/2015 FINAL  Final  Organism ID, Bacteria ENTEROCOCCUS SPECIES  Final   Organism ID, Bacteria PSEUDOMONAS AERUGINOSA  Final      Susceptibility   Pseudomonas aeruginosa - MIC*    CEFTAZIDIME 4 SENSITIVE Sensitive     CIPROFLOXACIN <=0.25 SENSITIVE Sensitive     GENTAMICIN 2 SENSITIVE Sensitive     IMIPENEM 1 SENSITIVE Sensitive     PIP/TAZO <=4 SENSITIVE Sensitive     CEFEPIME 2 SENSITIVE Sensitive     * 60,000 COLONIES/ml PSEUDOMONAS AERUGINOSA   Enterococcus species - MIC*    AMPICILLIN <=2 SENSITIVE Sensitive     LEVOFLOXACIN 1 SENSITIVE Sensitive     NITROFURANTOIN <=16 SENSITIVE Sensitive     VANCOMYCIN 1 SENSITIVE Sensitive     * >=100,000 COLONIES/mL ENTEROCOCCUS SPECIES  MRSA PCR Screening     Status: Abnormal   Collection Time: 05/27/15  2:30 PM  Result Value Ref Range Status   MRSA by PCR POSITIVE (A) NEGATIVE Final    Comment:        The GeneXpert MRSA Assay (FDA approved for NASAL specimens only), is one component of a comprehensive MRSA colonization surveillance program. It is  not intended to diagnose MRSA infection nor to guide or monitor treatment for MRSA infections. RESULT CALLED TO, READ BACK BY AND VERIFIED WITH: Laqueta Jean RN AT 1725 ON 05/27/15 BY INGLEE      Studies: Ct Head Wo Contrast  05/27/2015  CLINICAL DATA:  74 year old female with profound confusion, agitation. Initial encounter. EXAM: CT HEAD WITHOUT CONTRAST TECHNIQUE: Contiguous axial images were obtained from the base of the skull through the vertex without intravenous contrast. COMPARISON:  Neck CT without contrast 0827 hours today. Head CT 05/12/2015. FINDINGS: Visualized paranasal sinuses and mastoids are clear. No acute osseous abnormality identified. Mild face and scalp edema about the base of the skull. Orbits soft tissues remain normal. Calcified atherosclerosis at the skull base.+++ confluent bilateral cerebral white matter hypodensity appears stable. No midline shift, mass effect, or evidence of intracranial mass lesion. No ventriculomegaly. No acute intracranial hemorrhage identified. No suspicious intracranial vascular hyperdensity. IMPRESSION: No acute intracranial abnormality. Stable noncontrast CT appearance of the brain with advanced nonspecific white matter changes. Electronically Signed   By: Odessa Fleming M.D.   On: 05/27/2015 13:40    Scheduled Meds: . amantadine  100 mg Oral BID  . antiseptic oral rinse  7 mL Mouth Rinse q12n4p  . aspirin EC  81 mg Oral Daily  . aspirin  150 mg Rectal Daily  . buPROPion  150 mg Oral Daily  . chlorhexidine  15 mL Mouth Rinse BID  . Chlorhexidine Gluconate Cloth  6 each Topical Q0600  . FLUoxetine  20 mg Oral Daily  . gabapentin  200 mg Oral TID  . heparin  5,000 Units Subcutaneous 3 times per day  . lamoTRIgine  200 mg Oral Daily  . metoprolol tartrate  12.5 mg Oral BID  . mupirocin ointment  1 application Nasal BID  . piperacillin-tazobactam (ZOSYN)  IV  3.375 g Intravenous Q8H  . sodium chloride  3 mL Intravenous Q12H  . vancomycin  1,000 mg  Intravenous Q24H   Continuous Infusions:    Principal Problem:   Sepsis (HCC) Active Problems:   Bipolar I disorder (HCC)   Essential hypertension   GERD (gastroesophageal reflux disease)   Cellulitis, neck   Tremor   Confusion    Time spent: 35 minutes    Vassie Loll  [facility] Pager [phone]. If 7PM-7AM, please contact night-coverage at  [email or web address], password [email or web address] 05/29/2015, 9:56 AM  LOS: 2 days

## 2015-05-29 NOTE — Progress Notes (Signed)
Pt is accepting minimal food at this time due to her current mental status.  An attempt to utilize pudding as a vehicle for oral medication was unsuccessful on the previous shift.  As a matter of safety with respect to aspirations concerns, oral med are being held at this time.  I will notify [facility] of this strategy and await further orders.

## 2015-05-29 NOTE — Progress Notes (Signed)
Pt with persistent tremors due to tardive dyskinesia causing excessive monitor alarms.  Monitor also unreadable due to tremors.  Monitor is now currently disabled.  Advised both [facility] and [facility] as to the status of the Pt and their respective monitor.  Pt is currently under the observation of a 24 hr sitter.  Will continue to closely monitor the Pt status.

## 2015-05-29 NOTE — Progress Notes (Signed)
  CHL IP CLINICAL IMPRESSIONS 05/29/2015  Therapy Diagnosis Mild oral phase dysphagia;Mild pharyngeal phase dysphagia;Suspected primary esophageal dysphagia  Clinical Impression Mild oropharyngeal dysphagia with suspected primary esophageal deficits.  Dysphagia characterized decreased oral bolus coordination, impaired propulsion and premature spillage into pharynx.  Pt unable to orally transit barium tablet with pudding, despite multiple efforts.  Tablet expectorated per SlP cue. Pharyngeal swallow characterized by minimal delay in swallow reflex.  Mild pharyngeal residuals with poorly masticated cracker present without pt sensation.  Trace pharyngeal residuals noted with liquids - cued dry swallows beneficial.    No aspiration or laryngeal penetration noted.  Using teach back, live video, educated pt and family to findings/recommendations.  Will follow up for tolerance, readiness for dietary advancement.     Of note, pt did NOT cough during MBS as observed consistently at bedside.   Only able to cursory view proximal esophagus = pt appeared with minimal amount of residuals at proximal esophagus without awareness,  Thin water consumption did not appear to help fully clear.  Pt does report recent issues with sensing food lodging in esophagus x approximately six weeks requiring her to reswallow or expectorate; therefore suspect esophageal component to her dysphagia.  Radiologist not present to confirm findings  Impact on safety and function Risk for inadequate nutrition/hydration;Moderate aspiration risk     CHL IP TREATMENT RECOMMENDATION 05/29/2015  Treatment Recommendations Therapy as outlined in treatment plan below    Prognosis 05/29/2015  Prognosis for Safe Diet Advancement Fair  Barriers to Reach Goals Cognitive deficits  Barriers/Prognosis Comment --    CHL IP DIET RECOMMENDATION 05/29/2015  SLP Diet Recommendations Regular solids;Thin liquid  Liquid Administration via Cup;Straw   Medication Administration Crushed with puree  Compensations Slow rate;Small sips/bites  Postural Changes Seated upright at 90 degrees;Remain semi-upright after after feeds/meals (Comment)     CHL IP OTHER RECOMMENDATIONS 05/29/2015  Recommended Consults Consider esophageal assessment  Oral Care Recommendations Oral care BID  Other Recommendations Clarify dietary restrictions     CH[facility] IP FOLLOW UP RECOMMENDATIONS 05/29/2015  Follow up Recommendations Skilled Nursing facility     Justinamara Nils Thor, [place] [facility] SLP [phone]

## 2015-05-30 DIAGNOSIS — L0211 Cutaneous abscess of neck: Secondary | ICD-10-CM

## 2015-05-30 DIAGNOSIS — N179 Acute kidney failure, unspecified: Secondary | ICD-10-CM | POA: Insufficient documentation

## 2015-05-30 LAB — BASIC METABOLIC PANEL
ANION GAP: 12 (ref 5–15)
BUN: 25 mg/dL — ABNORMAL HIGH (ref 6–20)
CO2: 22 mmol/L (ref 22–32)
Calcium: 9.4 mg/dL (ref 8.9–10.3)
Chloride: 116 mmol/L — ABNORMAL HIGH (ref 101–111)
Creatinine, Ser: 1.04 mg/dL — ABNORMAL HIGH (ref 0.44–1.00)
GFR calc Af Amer: >60 mL/min (ref >60)
GFR, EST NON AFRICAN AMERICAN: 53 mL/min — AB (ref >60)
GLUCOSE: 152 mg/dL — AB (ref 65–99)
POTASSIUM: 3.4 mmol/L — AB (ref 3.5–5.1)
Sodium: 150 mmol/L — ABNORMAL HIGH (ref 135–145)

## 2015-05-30 LAB — CBC
HEMATOCRIT: 34.7 % — AB (ref 36.0–46.0)
HEMOGLOBIN: 11 g/dL — AB (ref 12.0–15.0)
MCH: 30.6 pg (ref 26.0–34.0)
MCHC: 31.7 g/dL (ref 30.0–36.0)
MCV: 96.4 fL (ref 78.0–100.0)
Platelets: 228 10*3/uL (ref 150–400)
RBC: 3.6 MIL/uL — AB (ref 3.87–5.11)
RDW: 16.4 % — ABNORMAL HIGH (ref 11.5–15.5)
WBC: 9.9 10*3/uL (ref 4.0–10.5)

## 2015-05-30 MED ORDER — DEXTROSE 5 % IV SOLN
INTRAVENOUS | Status: DC
  Administered 2015-05-30 – 2015-05-31 (×2): via INTRAVENOUS

## 2015-05-30 MED ORDER — VANCOMYCIN HCL IN DEXTROSE 750-5 MG/150ML-% IV SOLN
750.0000 mg | Freq: Two times a day (BID) | INTRAVENOUS | Status: DC
  Administered 2015-05-30 – 2015-06-01 (×4): 750 mg via INTRAVENOUS
  Filled 2015-05-30 (×5): qty 150

## 2015-05-30 NOTE — Progress Notes (Signed)
Pharmacy Antibiotic Follow-up Note  Alexis Thompson is a 74 y.o. year-old female admitted on 05/27/2015.  The patient is currently on day 4 of Vancomycin and Zosyn for sepsis 2/2 STI/parotiditis, UTI.   Assessment/Plan: MD plans to narrow antibiotics in next 24-48 hours pending further improvement and cultures.  Renal fxn has improved since admission therefore will adjust vancomycin dose.  WBC now WNL.  Afebrile.    Continue Zosyn 3.375g IV q8h (infuse over 4 hours) Change to Vancomycin 750mg  IV q12h F/u duration of antibiotics, ability to narrow with culture data/clinical picture  Temp (24hrs), Avg:98.4 F (36.9 C), Min:97.6 F (36.4 C), Max:99 F (37.2 C)   Recent Labs Lab 05/27/15 0650 05/28/15 0145 05/30/15 0405  WBC 32.3* 19.4* 9.9    Recent Labs Lab 05/27/15 0650 05/28/15 0145 05/30/15 0405  CREATININE 1.46* 1.09* 1.04*   Estimated Creatinine Clearance: 50.9 mL/min (by C-G formula based on Cr of 1.04).    Allergies  Allergen Reactions  . Clindamycin/Lincomycin Anaphylaxis  . Codeine Other (See Comments)    Listed on MAR  . Glipizide Other (See Comments)    Listed on MAR  . Tetracyclines & Related Other (See Comments)    Listed on MAR    Antimicrobials this admission: PTA clindamycin >> 12/14 allergic reaction 12/14 >> Zosyn >>   12/14 >> Vancomycin >>  Microbiology results: **Cx drawn after Clindamycin** 12/14 blood: NGTD 12/14 urine: Enterococcus and Pseudomonas (both pansensitive) 12/14 MRSA PCR+  Dose changes/levels: 12/17: Vanc 1g q24h --> 750mg  IV q12h for improved renal fxn  Thank you for allowing pharmacy to be a part of this patient's care.  Haynes Hoehnolleen Janete Quilling, PharmD, BCPS 05/30/2015, 2:24 PM  Pager: [phone]

## 2015-05-30 NOTE — Progress Notes (Signed)
[facility] PROGRESS NOTE  Rielly Brunn ZOX:[id number] DOB: Dec 10, 1940 DOA: 05/27/2015 PCP: No PCP Per Patient  Assessment/Plan: 1. Sepsis due to soft tissue neck infection/parotiditis. CT Neck noted, blood cultures (no growth up to date), had allergic reaction to clindamycin at SNF. -Will still continue Zosyn + Vancomycin; depending further improvement will narrow antibiotics in the next 24-48 hours.  -ENT Dr. Jearld Fenton has seen patient and recommended warm compresses to affected area; no further intervention anticipated at this point -much better and with septic features essentially resolved now -continue dysphagia 2 diet -slowly improving -patient is stable and will transfer to med-surg with sitter   2. Allergic reaction to clindamycin. Has received Solu-Medrol and epinephrine in the ER, no signs of lip or tongue swelling, no stridor or wheezing. Stable. -will monitor  -patient with some hoarseness; but continue improving   3. Nonstop tremors. Per family started after she was given latuda few months ago, She appears to have tardive dyskinesia/akathisia on exam -trial of cogentin and neurontin initiated on admission -after discussing with neurology plan is to use neurontin alone; dose adjusted to  TID trying to  minimize sedative effect -continue use of amantadine   4. Encephalopathy. Likely due to combination of #1 and She also has underlying dementia per family, which make her at risk for delirium, she has been hallucinating at the nursing home for the last few weeks.  -will Hold tramadol, Neurontin, didtrepan, Tranexene. -will minimize narcotics and other sedatives -continue treatment of infection  -B12 and RPR WNL and neg respectively  -CT head neg for acute abnormalities   5. Bipolar disorder with suicidal ideation in the past. For now will continue Lamictal, Wellbutrin and Prozac  -has tolerated Dysphagia 2 diet as per SPL rec's  6. Enterococcus and Pseudomonas UTI:  On vancomycin and Zosyn -will discontinue foley and assess for retention -follow cx data to narrow antibiotics -patient with incontinence, but voiding properly   7. Mildly elevated troponin. Appears to be due to sepsis, doubt ACS, EKG appears nonacute although very poor baseline due to tremors. -troponin with flat elevation and trending down -2-D echo w/o acute wall motion abnormalities and preserved EF -continue ASA and statins   8. Dyslipidemia. Plan is to continue statins.  9. 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 use chlorhexidine and mupirocin -patient will continue contact precaution    11-AKI: due to dehydration and pre-renal azotemia -improved/resolved with IVF's -will monitor trend  12-hypernatremia: sodium up to 150 range -will change IVF's to D5W -follow electrolytes in am  13-dysphagia  -continue dysphagia 2 diet as recommended by SPL  Code Status: DNR Family Communication: daughter at bedside  Disposition Plan: will monitor for another 24 hours in stepdown due to psychologic demands; will follow SPL rec's for safety with swallowing and advance diet   Consultants:  Neurology  ENT  Procedures:  See below for x-ray reports   Carotid duplex: Bilateral: 1-39% ICA stenosis. Vertebral artery flow is antegrade.   2-D echo: - Left ventricle: The cavity size was normal. Wall thickness was increased in a pattern of mild LVH. Systolic function was normal. The estimated ejection fraction was in the range of 55% to 60%. Wall motion was normal; there were no regional wall motion abnormalities. Left ventricular diastolic function parameters were normal. - Atrial septum: No defect or patent foramen ovale was identified. - Pulmonary arteries: PA peak pressure: 40 mm Hg (S).  Antibiotics:  Vancomycin and  zosyn 12/14  HPI/Subjective: Afebrile, still with intermittent episodes of  confusion and ongoing tremors/restlessness state. Patient denies SOB and CP. Neck erythema and swelling improved.   Objective: Filed Vitals:   05/30/15 0700 05/30/15 0748  BP:    Pulse: 64   Temp:  99 F (37.2 C)  Resp:      Intake/Output Summary (Last 24 hours) at 05/30/15 0907 Last data filed at 05/30/15 0000  Gross per 24 hour  Intake 1202.5 ml  Output      0 ml  Net 1202.5 ml   Filed Weights   05/27/15 1400 05/28/15 0313 05/30/15 0500  Weight: 88.6 kg (195 lb 5.2 oz) 89.1 kg (196 lb 6.9 oz) 88.5 kg (195 lb 1.7 oz)    Exam:   General:  remained afebrile, she continue to be experiencing tremors and episodes of sundowning/confusion. This morning is oriented X 1-2 (able to recognize her nurse)    Cardiovascular: S1 and s2, no rubs or gallops  Respiratory: CTA bilaterally  Abdomen: soft, NT, ND, positive BS  Musculoskeletal: no edema, no cyanosis   Skin: with right side of her neck a lot less swollen and with less redness; pain also improved   Data Reviewed: Basic Metabolic Panel:  Recent Labs Lab 05/27/15 0650 05/28/15 0145 05/30/15 0405  NA 142 146* 150*  K 3.8 4.0 3.4*  CL 107 117* 116*  CO2 15* 20* 22  GLUCOSE 192* 113* 152*  BUN 40* 28* 25*  CREATININE 1.46* 1.09* 1.04*  CALCIUM 10.2 8.8* 9.4   Liver Function Tests:  Recent Labs Lab 05/27/15 0650  AST 20  ALT 19  ALKPHOS 75  BILITOT 1.1  PROT 7.6  ALBUMIN 4.2   CBC:  Recent Labs Lab 05/27/15 0650 05/28/15 0145 05/30/15 0405  WBC 32.3* 19.4* 9.9  NEUTROABS 24.9*  --   --   HGB 13.5 10.4* 11.0*  HCT 40.5 32.4* 34.7*  MCV 93.3 96.1 96.4  PLT 245 191 228   Cardiac Enzymes:  Recent Labs Lab 05/27/15 0650 05/27/15 1204 05/27/15 1914 05/28/15 0145  TROPONINI 0.11* 0.10* 0.08* 0.07*    Recent Results (from the past 240 hour(s))  Blood culture (routine x 2)     Status: None (Preliminary result)   Collection Time: 05/27/15  6:50 AM  Result Value Ref Range Status   Specimen  Description BLOOD RIGHT FOREARM  Final   Special Requests BOTTLES DRAWN AEROBIC AND ANAEROBIC 6ML  Final   Culture   Final    NO GROWTH 2 DAYS [facility] at [facility]    Report Status PENDING  Incomplete  Blood culture (routine x 2)     Status: None (Preliminary result)   Collection Time: 05/27/15  7:16 AM  Result Value Ref Range Status   Specimen Description BLOOD RIGHT HAND  Final   Special Requests BOTTLES DRAWN AEROBIC AND ANAEROBIC 5ML  Final   Culture   Final    NO GROWTH 2 DAYS Performed at [facility]    Report Status PENDING  Incomplete  Urine culture     Status: None   Collection Time: 05/27/15 10:47 AM  Result Value Ref Range Status   Specimen Description URINE, RANDOM  Final   Special Requests NONE Clindamycin x 2 days  Final   Culture   Final    >=100,000 COLONIES/mL ENTEROCOCCUS SPECIES 60,000 COLONIES/ml PSEUDOMONAS AERUGINOSA Performed at [facility]    Report Status 05/29/2015 FINAL  Final   Organism ID, Bacteria ENTEROCOCCUS  SPECIES  Final   Organism ID, Bacteria PSEUDOMONAS AERUGINOSA  Final      Susceptibility   Pseudomonas aeruginosa - MIC*    CEFTAZIDIME 4 SENSITIVE Sensitive     CIPROFLOXACIN <=0.25 SENSITIVE Sensitive     GENTAMICIN 2 SENSITIVE Sensitive     IMIPENEM 1 SENSITIVE Sensitive     PIP/TAZO <=4 SENSITIVE Sensitive     CEFEPIME 2 SENSITIVE Sensitive     * 60,000 COLONIES/ml PSEUDOMONAS AERUGINOSA   Enterococcus species - MIC*    AMPICILLIN <=2 SENSITIVE Sensitive     LEVOFLOXACIN 1 SENSITIVE Sensitive     NITROFURANTOIN <=16 SENSITIVE Sensitive     VANCOMYCIN 1 SENSITIVE Sensitive     * >=100,000 COLONIES/mL ENTEROCOCCUS SPECIES  MRSA PCR Screening     Status: Abnormal   Collection Time: 05/27/15  2:30 PM  Result Value Ref Range Status   MRSA by PCR POSITIVE (A) NEGATIVE Final    Comment:        The GeneXpert MRSA Assay (FDA approved for NASAL specimens only), is one component of a comprehensive MRSA  colonization surveillance program. It is not intended to diagnose MRSA infection nor to guide or monitor treatment for MRSA infections. RESULT CALLED TO, READ BACK BY AND VERIFIED WITH: Laqueta Jean RN AT 1725 ON 05/27/15 BY INGLEE      Studies: Dg Swallowing Func-speech Pathology  05/29/2015  Objective Swallowing Evaluation:   Patient Details Name: Ataya Murdy MRN: [id number] Date of Birth: 23-Sep-1940 Today's Date: 05/29/2015 Time: SLP Start Time (ACUTE ONLY): 1145-SLP Stop Time (ACUTE ONLY): 1220 SLP Time Calculation (min) (ACUTE ONLY): 35 min Past Medical History: Past Medical History Diagnosis Date . Bipolar 1 disorder (HCC)  . Diabetes mellitus without complication (HCC)  . Hypertension  . High cholesterol  . GERD (gastroesophageal reflux disease)  . Depression  . Psychosis  . Suicide attempt by multiple drug overdose [facility])  Past Surgical History: No past surgical history on file. HPI: 74 yo female adm to [facility] with cellulitis due to allergy to clindamycin, PMH + for bipolar disorder, akathisia - medicine induced.  CT neck showed mild lingual and aryepiglottic fold edema.  Pt CXR negative and CT head negative.  She did have a CXR 05/02/15 for fever/sore throat that was also negative.  Swallow evaluation ordered.  Subjective: pt awake in chair, RN and daughter Eunice Blase accompained pt to xray Assessment / Plan / Recommendation CHL IP CLINICAL IMPRESSIONS 05/29/2015 Therapy Diagnosis Mild oral phase dysphagia;Mild pharyngeal phase dysphagia;Suspected primary esophageal dysphagia Clinical Impression Mild oropharyngeal dysphagia with suspected primary esophageal deficits.  Dysphagia characterized decreased oral bolus coordination, impaired propulsion and premature spillage into pharynx.  Pt unable to orally transit barium tablet with pudding, despite multiple efforts.  Tablet expectorated per SlP cue. Pharyngeal swallow characterized by minimal delay in swallow reflex.  Mild pharyngeal residuals with poorly  masticated cracker present without pt sensation.  Trace pharyngeal residuals noted with liquids - cued dry swallows beneficial.    No aspiration or laryngeal penetration noted.  Using teach back, live video, educated pt and family to findings/recommendations.  Will follow up for tolerance, readiness for dietary advancement.   Of note, pt did NOT cough during MBS as observed consistently at bedside. Only able to cursory view proximal esophagus = pt appeared with minimal amount of residuals at proximal esophagus without awareness,  Thin water consumption did not appear to help fully clear.  Pt does report recent issues with sensing food lodging in esophagus x approximately six  weeks requiring her to reswallow or expectorate; therefore suspect esophageal component to her dysphagia.  Radiologist not present to confirm findings Impact on safety and function Risk for inadequate nutrition/hydration;Moderate aspiration risk   CHL IP TREATMENT RECOMMENDATION 05/29/2015 Treatment Recommendations Therapy as outlined in treatment plan below   Prognosis 05/29/2015 Prognosis for Safe Diet Advancement Fair Barriers to Reach Goals Cognitive deficits Barriers/Prognosis Comment -- CHL IP DIET RECOMMENDATION 05/29/2015 SLP Diet Recommendations Regular solids;Thin liquid Liquid Administration via Cup;Straw Medication Administration Crushed with puree Compensations Slow rate;Small sips/bites Postural Changes Seated upright at 90 degrees;Remain semi-upright after after feeds/meals (Comment)   CHL IP OTHER RECOMMENDATIONS 05/29/2015 Recommended Consults Consider esophageal assessment Oral Care Recommendations Oral care BID Other Recommendations Clarify dietary restrictions   CHL IP FOLLOW UP RECOMMENDATIONS 05/29/2015 Follow up Recommendations Skilled Nursing facility   [facility] IP FREQUENCY AND DURATION 05/29/2015 Speech Therapy Frequency (ACUTE ONLY) min 2x/week Treatment Duration 2 weeks      CHL IP ORAL PHASE 05/29/2015 Oral Phase Impaired  Oral - Pudding Teaspoon -- Oral - Pudding Cup -- Oral - Honey Teaspoon -- Oral - Honey Cup -- Oral - Nectar Teaspoon Weak lingual manipulation;Premature spillage;Reduced posterior propulsion Oral - Nectar Cup -- Oral - Nectar Straw Premature spillage;Reduced posterior propulsion;Weak lingual manipulation Oral - Thin Teaspoon Premature spillage;Reduced posterior propulsion;Weak lingual manipulation Oral - Thin Cup -- Oral - Thin Straw Weak lingual manipulation;Reduced posterior propulsion;Premature spillage Oral - Puree Weak lingual manipulation;Premature spillage;Reduced posterior propulsion Oral - Mech Soft -- Oral - Regular Weak lingual manipulation;Premature spillage;Reduced posterior propulsion;Impaired mastication;Delayed oral transit;Lingual/palatal residue Oral - Multi-Consistency -- Oral - Pill Weak lingual manipulation;Lingual pumping;Decreased bolus cohesion Oral Phase - Comment --  CHL IP PHARYNGEAL PHASE 05/29/2015 Pharyngeal Phase Impaired Pharyngeal- Pudding Teaspoon -- Pharyngeal -- Pharyngeal- Pudding Cup -- Pharyngeal -- Pharyngeal- Honey Teaspoon -- Pharyngeal -- Pharyngeal- Honey Cup -- Pharyngeal -- Pharyngeal- Nectar Teaspoon Lateral channel residue Pharyngeal -- Pharyngeal- Nectar Cup -- Pharyngeal -- Pharyngeal- Nectar Straw Lateral channel residue Pharyngeal -- Pharyngeal- Thin Teaspoon Lateral channel residue;Delayed swallow initiation-pyriform sinuses Pharyngeal -- Pharyngeal- Thin Cup -- Pharyngeal -- Pharyngeal- Thin Straw Lateral channel residue;Pharyngeal residue - valleculae Pharyngeal -- Pharyngeal- Puree Delayed swallow initiation-vallecula Pharyngeal -- Pharyngeal- Mechanical Soft -- Pharyngeal -- Pharyngeal- Regular Delayed swallow initiation-vallecula;Pharyngeal residue - valleculae;Reduced tongue base retraction Pharyngeal -- Pharyngeal- Multi-consistency -- Pharyngeal -- Pharyngeal- Pill NT Pharyngeal -- Pharyngeal Comment --  CHL IP CERVICAL ESOPHAGEAL PHASE 05/29/2015 Cervical  Esophageal Phase Impaired Pudding Teaspoon -- Pudding Cup -- Honey Teaspoon -- Honey Cup -- Nectar Teaspoon -- Nectar Cup -- Nectar Straw -- Thin Teaspoon -- Thin Cup -- Thin Straw -- Puree -- Mechanical Soft -- Regular -- Multi-consistency -- Pill -- Cervical Esophageal Comment Only able to cursory view proximal esophagus = pt appeared with minimal amount of residuals at proximal esophagus without awareness,  Thin water consumption did not appear to help fully clear.  Pt does report recent issues with sensing food lodging in esophagus x approximately six weeks requiring her to reswallow or expectorate; therefore suspect esophageal component to her dysphagia.  Radiologist not present to confirm findings Donavan Burnet, MS [facility] SLP [phone]               Scheduled Meds: . amantadine  100 mg Oral BID  . antiseptic oral rinse  7 mL Mouth Rinse q12n4p  . aspirin EC  81 mg Oral Daily  . aspirin  150 mg Rectal Daily  . buPROPion  150 mg Oral Daily  .  chlorhexidine  15 mL Mouth Rinse BID  . Chlorhexidine Gluconate Cloth  6 each Topical Q0600  . FLUoxetine  20 mg Oral Daily  . gabapentin  200 mg Oral TID  . heparin  5,000 Units Subcutaneous 3 times per day  . lamoTRIgine  200 mg Oral Daily  . metoprolol  5 mg Intravenous 3 times per day  . mupirocin ointment  1 application Nasal BID  . piperacillin-tazobactam (ZOSYN)  IV  3.375 g Intravenous Q8H  . simvastatin  20 mg Oral q1800  . sodium chloride  3 mL Intravenous Q12H  . vancomycin  1,000 mg Intravenous Q24H   Continuous Infusions: . dextrose 5 % and 0.45% NaCl 1,000 mL (05/30/15 0104)    Principal Problem:   Sepsis (HCC) Active Problems:   Bipolar I disorder (HCC)   Essential hypertension   GERD (gastroesophageal reflux disease)   Cellulitis, neck   Tremor   Confusion    Time spent: 35 minutes    Vassie Loll  [facility] Pager [phone]. If 7PM-7AM, please contact night-coverage at [email or web address], password  [facility] 05/30/2015, 9:07 AM  LOS: 3 days

## 2015-05-31 LAB — BASIC METABOLIC PANEL
Anion gap: 10 (ref 5–15)
BUN: 18 mg/dL (ref 6–20)
CALCIUM: 8.9 mg/dL (ref 8.9–10.3)
CO2: 21 mmol/L — AB (ref 22–32)
CREATININE: 1.01 mg/dL — AB (ref 0.44–1.00)
Chloride: 112 mmol/L — ABNORMAL HIGH (ref 101–111)
GFR calc Af Amer: >60 mL/min (ref >60)
GFR, EST NON AFRICAN AMERICAN: 55 mL/min — AB (ref >60)
GLUCOSE: 152 mg/dL — AB (ref 65–99)
Potassium: 3.1 mmol/L — ABNORMAL LOW (ref 3.5–5.1)
Sodium: 143 mmol/L (ref 135–145)

## 2015-05-31 LAB — MAGNESIUM: Magnesium: 1.4 mg/dL — ABNORMAL LOW (ref 1.7–2.4)

## 2015-05-31 MED ORDER — POTASSIUM CHLORIDE 10 MEQ/100ML IV SOLN
10.0000 meq | INTRAVENOUS | Status: AC
  Administered 2015-05-31 (×3): 10 meq via INTRAVENOUS
  Filled 2015-05-31 (×3): qty 100

## 2015-05-31 NOTE — Progress Notes (Signed)
[facility] PROGRESS NOTE  Edwena Feltyatsy Carpenter XLK:[id number] DOB: July 13, 1940 DOA: 05/27/2015 PCP: No PCP Per Patient  Assessment/Plan: 1. Sepsis due to soft tissue neck infection/parotiditis. CT Neck noted, blood cultures (no growth up to date), had allergic reaction to clindamycin at SNF. -Will still continue Zosyn + Vancomycin; depending further improvement will narrow antibiotics in the next 24-48 hours.  -ENT Dr. Jearld FentonByers has seen patient and recommended warm compresses to affected area; no further intervention anticipated at this point -much better and with septic features essentially resolved  -continue dysphagia 2 diet -slowly improving -will ask for PT evaluation  2. Allergic reaction to clindamycin. Has received Solu-Medrol and epinephrine in the ER, no signs of lip or tongue swelling, no stridor or wheezing. Stable. -will monitor  -patient with some hoarseness; but continue improving   3. Nonstop tremors. Per family started after she was given latuda few months ago, She appears to have tardive dyskinesia/akathisia on exam -trial of cogentin and neurontin initiated on admission -after discussing with neurology plan is to use neurontin alone; dose adjusted to 200mg  TID trying to  minimize sedative effect -continue use of amantadine   4. Encephalopathy. Likely due to combination of #1 and She also has underlying dementia per family, which make her at risk for delirium, she has been hallucinating at the nursing home for the last few weeks.  -will continue Holding tramadol, antipsychotics and Tranexene. -will minimize narcotics and other sedatives -continue treatment of infection  -B12 and RPR WNL and neg respectively  -CT head neg for acute abnormalities   5. Bipolar disorder with suicidal ideation in the past. For now will continue Lamictal, Wellbutrin and Prozac  -has tolerated Dysphagia 2 diet as per SPL rec's  6. Enterococcus and Pseudomonas UTI: On vancomycin and  Zosyn -will discontinue foley and assess for retention -follow cx data to narrow antibiotics -patient with incontinence, but voiding properly   7. Mildly elevated troponin. Appears to be due to sepsis, doubt ACS, EKG appears nonacute although very poor baseline due to tremors. -troponin with flat elevation and trending down -2-D echo w/o acute wall motion abnormalities and preserved EF -continue ASA and statins   8. Dyslipidemia. Plan is to continue statins.  9. 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 use chlorhexidine and mupirocin -patient will continue contact precaution    11-AKI: due to dehydration and pre-renal azotemia -improved/resolved with IVF's -will monitor trend  12-hypernatremia: sodium down to 143 range -will adjust rate of IVF's  -follow electrolytes in am  13-dysphagia  -continue dysphagia 2 diet as recommended by SPL  Code Status: DNR Family Communication: daughter at bedside  Disposition Plan: will monitor for another 24 hours in stepdown due to psychologic demands; will follow SPL rec's for safety with swallowing and advance diet   Consultants:  Neurology  ENT  Procedures:  See below for x-ray reports   Carotid duplex: Bilateral: 1-39% ICA stenosis. Vertebral artery flow is antegrade.   2-D echo: - Left ventricle: The cavity size was normal. Wall thickness was increased in a pattern of mild LVH. Systolic function was normal. The estimated ejection fraction was in the range of 55% to 60%. Wall motion was normal; there were no regional wall motion abnormalities. Left ventricular diastolic function parameters were normal. - Atrial septum: No defect or patent foramen ovale was identified. - Pulmonary arteries: PA peak pressure: 40 mm Hg (S).  Antibiotics:  Vancomycin and zosyn 12/14  HPI/Subjective:  Afebrile, still with intermittent episodes of confusion and  sundowning. Patient is refusing meds this morning. Patient denies SOB and CP. Neck erythema and swelling almost completely resolved  Subjective: Filed Vitals:   05/30/15 2142 05/31/15 0558  BP: 147/53 144/58  Pulse: 67 51  Temp: 99.4 F (37.4 C) 98.4 F (36.9 C)  Resp: 20 20    Intake/Output Summary (Last 24 hours) at 05/31/15 1252 Last data filed at 05/31/15 1214  Gross per 24 hour  Intake 586.25 ml  Output      0 ml  Net 586.25 ml   Filed Weights   05/28/15 0313 05/30/15 0500 05/30/15 1130  Weight: 89.1 kg (196 lb 6.9 oz) 88.5 kg (195 lb 1.7 oz) 90.674 kg (199 lb 14.4 oz)    Exam:   General:  remained afebrile, she continue slowly improving and is less tremulous. Still with episodes of sundowning/confusion. This morning is oriented only to person and refusing medications    Cardiovascular: S1 and s2, no rubs or gallops  Respiratory: CTA bilaterally  Abdomen: soft, NT, ND, positive BS  Musculoskeletal: no edema, no cyanosis   Skin: with right side of her neck with almost complete resolution of erythema and swelling; no pain on palpation   Data Reviewed: Basic Metabolic Panel:  Recent Labs Lab 05/27/15 0650 05/28/15 0145 05/30/15 0405 05/31/15 0417  NA 142 146* 150* 143  K 3.8 4.0 3.4* 3.1*  CL 107 117* 116* 112*  CO2 15* 20* 22 21*  GLUCOSE 192* 113* 152* 152*  BUN 40* 28* 25* 18  CREATININE 1.46* 1.09* 1.04* 1.01*  CALCIUM 10.2 8.8* 9.4 8.9   Liver Function Tests:  Recent Labs Lab 05/27/15 0650  AST 20  ALT 19  ALKPHOS 75  BILITOT 1.1  PROT 7.6  ALBUMIN 4.2   CBC:  Recent Labs Lab 05/27/15 0650 05/28/15 0145 05/30/15 0405  WBC 32.3* 19.4* 9.9  NEUTROABS 24.9*  --   --   HGB 13.5 10.4* 11.0*  HCT 40.5 32.4* 34.7*  MCV 93.3 96.1 96.4  PLT 245 191 228   Cardiac Enzymes:  Recent Labs Lab 05/27/15 0650 05/27/15 1204 05/27/15 1914 05/28/15 0145  TROPONINI 0.11* 0.10* 0.08* 0.07*    Recent Results (from the past 240 hour(s))   Blood culture (routine x 2)     Status: None (Preliminary result)   Collection Time: 05/27/15  6:50 AM  Result Value Ref Range Status   Specimen Description BLOOD RIGHT FOREARM  Final   Special Requests BOTTLES DRAWN AEROBIC AND ANAEROBIC  Final   Culture   Final    NO GROWTH 4 DAYS Performed at [facility]    Report Status PENDING  Incomplete  Blood culture (routine x 2)     Status: None (Preliminary result)   Collection Time: 05/27/15  7:16 AM  Result Value Ref Range Status   Specimen Description BLOOD RIGHT HAND  Final   Special Requests BOTTLES DRAWN AEROBIC AND ANAEROBIC  Final   Culture   Final    NO GROWTH 4 DAYS Performed at [facility]    Report Status PENDING  Incomplete  Urine culture     Status: None   Collection Time: 05/27/15 10:47 AM  Result Value Ref Range Status   Specimen Description URINE, RANDOM  Final   Special Requests NONE Clindamycin x 2 days  Final   Culture   Final    >=100,000 COLONIES/mL ENTEROCOCCUS SPECIES 60,000 COLONIES/ml PSEUDOMONAS AERUGINOSA Performed at [facility]    Report Status 05/29/2015 FINAL  Final   Organism ID, Bacteria ENTEROCOCCUS SPECIES  Final   Organism ID, Bacteria PSEUDOMONAS AERUGINOSA  Final      Susceptibility   Pseudomonas aeruginosa - MIC*    CEFTAZIDIME 4 SENSITIVE Sensitive     CIPROFLOXACIN <=0.25 SENSITIVE Sensitive     GENTAMICIN 2 SENSITIVE Sensitive     IMIPENEM 1 SENSITIVE Sensitive     PIP/TAZO <=4 SENSITIVE Sensitive     CEFEPIME 2 SENSITIVE Sensitive     * 60,000 COLONIES/ml PSEUDOMONAS AERUGINOSA   Enterococcus species - MIC*    AMPICILLIN <=2 SENSITIVE Sensitive     LEVOFLOXACIN 1 SENSITIVE Sensitive     NITROFURANTOIN <=16 SENSITIVE Sensitive     VANCOMYCIN 1 SENSITIVE Sensitive     * >=100,000 COLONIES/mL ENTEROCOCCUS SPECIES  MRSA PCR Screening     Status: Abnormal   Collection Time: 05/27/15  2:30 PM  Result Value Ref Range Status   MRSA by PCR POSITIVE (A)  NEGATIVE Final    Comment:        The GeneXpert MRSA Assay (FDA approved for NASAL specimens only), is one component of a comprehensive MRSA colonization surveillance program. It is not intended to diagnose MRSA infection nor to guide or monitor treatment for MRSA infections. RESULT CALLED TO, READ BACK BY AND VERIFIED WITH: Laqueta Jean RN AT 1725 ON 05/27/15 BY INGLEE      Studies: No results found.  Scheduled Meds: . amantadine  100 mg Oral BID  . antiseptic oral rinse  7 mL Mouth Rinse q12n4p  . aspirin EC  81 mg Oral Daily  . aspirin  150 mg Rectal Daily  . buPROPion  150 mg Oral Daily  . chlorhexidine  15 mL Mouth Rinse BID  . Chlorhexidine Gluconate Cloth  6 each Topical Q0600  . FLUoxetine  20 mg Oral Daily  . gabapentin  200 mg Oral TID  . heparin  5,000 Units Subcutaneous 3 times per day  . lamoTRIgine  200 mg Oral Daily  . metoprolol  5 mg Intravenous 3 times per day  . mupirocin ointment  1 application Nasal BID  . piperacillin-tazobactam (ZOSYN)  IV  3.375 g Intravenous Q8H  . simvastatin  20 mg Oral q1800  . sodium chloride  3 mL Intravenous Q12H  . vancomycin  750 mg Intravenous Q12H   Continuous Infusions: . dextrose 75 mL/hr at 05/31/15 0500    Principal Problem:   Sepsis (HCC) Active Problems:   Bipolar I disorder (HCC)   Essential hypertension   GERD (gastroesophageal reflux disease)   Cellulitis, neck   Tremor   Confusion   AKI (acute kidney injury) (HCC)    Time spent: 35 minutes    Vassie Loll  [facility] Pager [phone]. If 7PM-7AM, please contact night-coverage at [email or web address], password [facility] 05/31/2015, 12:52 PM  LOS: 4 days

## 2015-05-31 NOTE — Plan of Care (Signed)
Problem: Safety: Goal: Ability to remain free from injury will improve Outcome: Progressing Sitter at bedside for safety, room close to nurses station with bed alarm on.

## 2015-06-01 ENCOUNTER — Telehealth: Payer: Self-pay | Admitting: *Deleted

## 2015-06-01 ENCOUNTER — Ambulatory Visit: Payer: Medicare Other | Admitting: Neurology

## 2015-06-01 DIAGNOSIS — R259 Unspecified abnormal involuntary movements: Secondary | ICD-10-CM

## 2015-06-01 LAB — BASIC METABOLIC PANEL
Anion gap: 7 (ref 5–15)
BUN: 12 mg/dL (ref 6–20)
CO2: 23 mmol/L (ref 22–32)
CREATININE: 1.01 mg/dL — AB (ref 0.44–1.00)
Calcium: 8.3 mg/dL — ABNORMAL LOW (ref 8.9–10.3)
Chloride: 111 mmol/L (ref 101–111)
GFR calc Af Amer: >60 mL/min (ref >60)
GFR, EST NON AFRICAN AMERICAN: 55 mL/min — AB (ref >60)
Glucose, Bld: 143 mg/dL — ABNORMAL HIGH (ref 65–99)
POTASSIUM: 3.4 mmol/L — AB (ref 3.5–5.1)
SODIUM: 141 mmol/L (ref 135–145)

## 2015-06-01 LAB — CULTURE, BLOOD (ROUTINE X 2)
Culture: NO GROWTH
Culture: NO GROWTH

## 2015-06-01 LAB — CBC
HEMATOCRIT: 33.9 % — AB (ref 36.0–46.0)
Hemoglobin: 10.9 g/dL — ABNORMAL LOW (ref 12.0–15.0)
MCH: 30.6 pg (ref 26.0–34.0)
MCHC: 32.2 g/dL (ref 30.0–36.0)
MCV: 95.2 fL (ref 78.0–100.0)
PLATELETS: 218 10*3/uL (ref 150–400)
RBC: 3.56 MIL/uL — ABNORMAL LOW (ref 3.87–5.11)
RDW: 15.9 % — AB (ref 11.5–15.5)
WBC: 8.3 10*3/uL (ref 4.0–10.5)

## 2015-06-01 MED ORDER — CEPHALEXIN 500 MG PO CAPS
500.0000 mg | ORAL_CAPSULE | Freq: Three times a day (TID) | ORAL | Status: DC
Start: 2015-06-01 — End: 2015-06-01

## 2015-06-01 MED ORDER — AMOXICILLIN-POT CLAVULANATE 875-125 MG PO TABS
1.0000 | ORAL_TABLET | Freq: Two times a day (BID) | ORAL | Status: DC
  Administered 2015-06-01 – 2015-06-03 (×5): 1 via ORAL
  Filled 2015-06-01 (×5): qty 1

## 2015-06-01 MED ORDER — METOPROLOL TARTRATE 25 MG PO TABS
12.5000 mg | ORAL_TABLET | Freq: Two times a day (BID) | ORAL | Status: DC
  Administered 2015-06-01 – 2015-06-03 (×3): 12.5 mg via ORAL
  Filled 2015-06-01 (×4): qty 1

## 2015-06-01 MED ORDER — LEVOFLOXACIN 750 MG PO TABS
750.0000 mg | ORAL_TABLET | Freq: Every day | ORAL | Status: DC
  Administered 2015-06-01 – 2015-06-02 (×2): 750 mg via ORAL
  Filled 2015-06-01 (×2): qty 1

## 2015-06-01 MED ORDER — POTASSIUM CHLORIDE CRYS ER 20 MEQ PO TBCR
40.0000 meq | EXTENDED_RELEASE_TABLET | ORAL | Status: AC
  Administered 2015-06-01 (×3): 40 meq via ORAL
  Filled 2015-06-01 (×3): qty 2

## 2015-06-01 NOTE — Evaluation (Signed)
Physical Therapy Evaluation Patient Details Name: Alexis Thompson MRN: [id number] DOB: 18-Nov-1940 Today's Date: 06/01/2015   History of Present Illness  74 yo female admitted with sepsis. Hx of bipolar d/o, DM, HTN, psychosis, suical ideation/attempt. Pt is from SNF  Clinical Impression  On eval, pt required Mod-Max assist +2 for mobility. Pt sat EOB for at least 8 minutes while working on static and dynamic sitting balance (Min guard-Min assist for stability). Pt with mod-heavy posterior lean affecting sitting and standing balance. Pt participated well with session and is motivated to progress physical activity as able. Recommend trial of rehab upon return to SNF.     Follow Up Recommendations SNF;Supervision/Assistance - 24 hour (recommend trial of rehab upon return to SNF)    Equipment Recommendations  None recommended by PT    Recommendations for Other Services       Precautions / Restrictions Precautions Precautions: Fall Restrictions Weight Bearing Restrictions: No      Mobility  Bed Mobility Overal bed mobility: Needs Assistance Bed Mobility: Supine to Sit     Supine to sit: Mod assist;+2 for safety/equipment;+2 for physical assistance;HOB elevated     General bed mobility comments: Assist for trunk and bil LEs. Utilized bedpad. Cues for technique.   Transfers Overall transfer level: Needs assistance Equipment used: Rolling walker (2 wheeled) Transfers: Sit to/from Stand;Lateral/Scoot Transfers Sit to Stand: Mod assist;+2 physical assistance;From elevated surface;+2 safety/equipment        Lateral/Scoot Transfers: Max assist;+2 physical assistance;+2 safety/equipment General transfer comment: x3. Assist to rise, stabilize, control descent. Mod-heavy posterior lean. Lateral scoot transfer, bed to recliner-utilized bedpad to aid with scooting,Difficulty due to short legs and posterior lean  Ambulation/Gait             General Gait Details: NT-unable at this  time  Stairs            Wheelchair Mobility    Modified Rankin (Stroke Patients Only)       Balance Overall balance assessment: Needs assistance Sitting-balance support: Feet supported Sitting balance-Leahy Scale: Fair Sitting balance - Comments: Worked on anterior weightshifting by having pt slide hands down anterior leg  5 reps x2.  Postural control: Posterior lean Standing balance support: Bilateral upper extremity supported;During functional activity Standing balance-Leahy Scale: Poor                               Pertinent Vitals/Pain Pain Assessment: No/denies pain    Home Living Family/patient expects to be discharged to:: Skilled nursing facility                      Prior Function Level of Independence: Needs assistance         Comments: pt was initially at SNF for ST rehab. Participating up until ~2 weeks ago-not progressing so was discharged from PT services.      Hand Dominance        Extremity/Trunk Assessment   Upper Extremity Assessment: Generalized weakness           Lower Extremity Assessment: Generalized weakness      Cervical / Trunk Assessment: Kyphotic  Communication   Communication: No difficulties  Cognition Arousal/Alertness: Awake/alert Behavior During Therapy: WFL for tasks assessed/performed Overall Cognitive Status: Within Functional Limits for tasks assessed                      General Comments  Exercises        Assessment/Plan    PT Assessment Patient needs continued PT services  PT Diagnosis Difficulty walking;Generalized weakness   PT Problem List Decreased strength;Decreased activity tolerance;Decreased balance;Decreased mobility;Obesity;Decreased knowledge of use of DME;Pain  PT Treatment Interventions DME instruction;Gait training;Functional mobility training;Therapeutic activities;Therapeutic exercise;Balance training   PT Goals (Current goals can be found in the  Care Plan section) Acute Rehab PT Goals Patient Stated Goal: to get better, stronger. to move again PT Goal Formulation: With patient/family Time For Goal Achievement: 06/15/15 Potential to Achieve Goals: Fair    Frequency Min 2X/week   Barriers to discharge        Co-evaluation               End of Session Equipment Utilized During Treatment: Gait belt Activity Tolerance: Patient tolerated treatment well Patient left: in chair;with call bell/phone within reach;with chair alarm set;with family/visitor present;with nursing/sitter in room           Time: 0942-1009 PT Time Calculation (min) (ACUTE ONLY): 27 min   Charges:   PT Evaluation $Initial PT Evaluation Tier I: 1 Procedure PT Treatments $Therapeutic Activity: 8-22 mins   PT G Codes:        Rebeca Alert, MPT Pager: [phone]

## 2015-06-01 NOTE — Progress Notes (Signed)
Speech Language Pathology Treatment: Dysphagia  Patient Details Name: Alexis Thompson MRN: [id number] DOB: 05/06/1941 Today's Date: 06/01/2015 Time: 1550-1620 SLP Time Calculation (min) (ACUTE ONLY): 30 min  Assessment / Plan / Recommendation Clinical Impression  Pt with much improved mental status today and daughter reports pt with great tolerance of intake with ability to self feed today!    Pt appears to continue with akathisia (excessive lingual movement) but appears much improved compared to during initial evaluation on Friday Dec 16th.    Ms Rede did not recall having undergone MBS on Friday but does recall having an endoscopy in the past - she does not recall results.    Observed pt consuming pudding, graham cracker and water. No indications of airway compromise noted.  Pt does take large sequential boluses and SLP advised small boluses.  Mildly prolonged mastication with cracker oral residuals noted likely due to akathisia - Pt compensated by using pudding to facilitate oral clearance.  Advised pt to use pudding, applesauce, puree, etc to facilitate oral clearance of particulate matter to prevent "washing" food or liquids into airway with oral transiting.    Recommend consider advancing diet to regular with thin liquids.  Pt agreeable to plan.  Suspect improvement in swallow due to resolution of allergic response and improved mental status.  SLP reviewed MBS with pt/family and clinical reasoning for precautions.    Will sign off, please reorder if indicated.  Thanks.      HPI HPI: 73 yo female adm to [facility] with cellulitis due to allergy to clindamycin, PMH + for bipolar disorder, akathisia - medicine induced.  CT neck showed mild lingual and aryepiglottic fold edema.  Pt CXR negative and CT head negative.  She did have a CXR 05/02/15 for fever/sore throat that was also negative.  Swallow evaluation ordered.       SLP Plan  Discharge SLP treatment due to (comment);All goals met      Recommendations  Diet recommendations: Regular;Thin liquid Liquids provided via: Cup;Straw Medication Administration: Whole meds with puree (start and follow with liquids) Supervision: Patient able to self feed Compensations: Slow rate;Small sips/bites (intermittent dry swallow, consume liquids t/o meal, reflux precautions) Postural Changes and/or Swallow Maneuvers: Seated upright 90 degrees;Upright 30-60 min after meal              Follow up Recommendations: None Plan: Discharge SLP treatment due to (comment);All goals met   ,  Ann  , MS CCC SLP [phone]    

## 2015-06-01 NOTE — Progress Notes (Signed)
Subjective: Markedly improved, still some problems with sundowning at night.   Exam: Filed Vitals:   05/31/15 2016 06/01/15 0422  BP: 121/43 128/57  Pulse: 50 51  Temp: 97.9 F (36.6 C) 97.9 F (36.6 C)  Resp: 20 20   Gen: In bed, NAD Resp: non-labored breathing, no acute distress HEENT: still tender over the parotids.   Neuro: MS: Awake, interactive, gives November at first, but corrects herself to December ZO:XWRUCN:EOMI, VFF, face symmetric.  Motor: She has 4/5 strength throughout, her akisthisia appears markedly improved.  Sensory: intact to LT  Pertinent Labs: leukocytosis  Impression: 74 yo F with a history of abnormal movements for 6 weeks and altered mental status. I suspect multifactorial delirium. She has as a very strong family history of restless leg syndrome and I suspect that adding antipsychotics contributed to the worsening of her akisthisia. I would favor continuing gabapentin for now given how much improvement she has made.   Recommendations: 1) continue gabapentin 200mg  TID 2) Follow up with neurology as outpatient.   Ritta SlotMcNeill Kirkpatrick, MD [facility] [phone]  If 7pm- 7am, please page neurology on call as listed in AMION.

## 2015-06-01 NOTE — Telephone Encounter (Signed)
no showed new patient appointment. 

## 2015-06-01 NOTE — Progress Notes (Signed)
[place], [place]. Care assumed for pt at this time. Pt resting in chair, chair alarm on.  Sitter and dtr at bedside. Assessment as charted. Contact precautions maintained. Pt w/ no c/o at present.

## 2015-06-01 NOTE — Progress Notes (Signed)
CSW continuing to follow.  Pt admitted from [facility] and plans to return when medically ready for discharge. [facility]. Pt has [employer] insurance and PT evaluated today and recommended trial of rehab upon return to SNF. CSW contacted [facility] in order to begin process of authorization to determine if [employer] will provide assistance with coverage [facility] and [facility] when stable.   CSW to continue to follow to provide support and assist with pt disposition needs.   Alexis Thompson, MSW, LCSW Clinical Social Work [phone]

## 2015-06-01 NOTE — Progress Notes (Signed)
[facility] PROGRESS NOTE  Alexis Thompson JYN:[id number] DOB: [birth year] DOA: 05/27/2015 PCP: No PCP Per Patient  Assessment/Plan: 1. Sepsis due to soft tissue neck infection/parotiditis. CT Neck noted, blood cultures (no growth up to date), had allergic reaction to clindamycin at SNF. -Will switch antibiotics to augmentin (case curbside with ID, Dr. Luciana Axeomer and will follow his rec's for antibiotic therapy) -ENT Dr. Jearld FentonByers has seen patient and recommended warm compresses to affected area; no further intervention anticipated at this point -septic features essentially resolved  -continue dysphagia 2 diet and follow SPL rec's -much better -will ask for PT evaluation  2. Allergic reaction to clindamycin. Has received Solu-Medrol and epinephrine in the ER, no signs of lip or tongue swelling, no stridor or wheezing. Stable. -will monitor  -patient with some hoarseness; but continue improving   3. Nonstop tremors. Per family started after she was given latuda few months ago, She appears to have tardive dyskinesia/akathisia on exam -trial of cogentin and neurontin initiated on admission -after discussing with neurology plan is to use neurontin alone; dose adjusted to 200mg  TID trying to  minimize sedative effect -continue use of amantadine   4. Encephalopathy. Likely due to combination of #1 and She also has underlying dementia per family, which make her at risk for delirium, she has been hallucinating at the nursing home for the last few weeks.  -will continue Holding tramadol, antipsychotics and Tranexene. -will minimize narcotics and other sedatives -continue treatment of infection  -B12 and RPR WNL and neg respectively  -CT head neg for acute abnormalities   5. Bipolar disorder with suicidal ideation in the past. For now will continue Lamictal, Wellbutrin and Prozac  -has tolerated Dysphagia 2 diet as per SPL rec's  6. Enterococcus and Pseudomonas UTI: On vancomycin and Zosyn since  admission for UTI and neck cellulitis  -patient with incontinence, but voiding properly after foley d/c -will switch to PO abx's; levaquin 750mg  daily and complete antibiotic therapy   7. Mildly elevated troponin. Appears to be due to sepsis, doubt ACS, EKG appears nonacute although very poor baseline due to tremors. -troponin with flat elevation and trending down -2-D echo w/o acute wall motion abnormalities and preserved EF -continue ASA and statins   8. Dyslipidemia. Plan is to continue statins.  9. 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 use chlorhexidine and mupirocin -patient will continue contact precaution    11-AKI: due to dehydration and pre-renal azotemia -improved/resolved with IVF's -will monitor trend  12-hypernatremia: sodium down to 143 range -will continue adjusting rate of IVF's  -follow electrolytes intermittently  13-dysphagia  -continue dysphagia 2 diet as recommended by SPL  Code Status: DNR Family Communication: daughter at bedside  Disposition Plan: will monitor for another 24 hours in stepdown due to psychologic demands; will follow SPL rec's for safety with swallowing and advance diet   Consultants:  Neurology  ENT  ID (Dr. Luciana Axeomer curbside)  Procedures:  See below for x-ray reports   Carotid duplex: Bilateral: 1-39% ICA stenosis. Vertebral artery flow is antegrade.   2-D echo: - Left ventricle: The cavity size was normal. Wall thickness was increased in a pattern of mild LVH. Systolic function was normal. The estimated ejection fraction was in the range of 55% to 60%. Wall motion was normal; there were no regional wall motion abnormalities. Left ventricular diastolic function parameters were normal. - Atrial septum: No defect or patent foramen ovale was identified. - Pulmonary arteries:  PA peak pressure: 40 mm Hg (S).  Antibiotics:  Vancomycin and zosyn  12/14>>12/19  levaquin 12/19  Augmentin 12/19  HPI/Subjective: Afebrile, AAOX3; essentially w/o any tremors and very cooperative with staff. Patient denies SOB and CP. Neck erythema and swelling almost completely resolved  Subjective: Filed Vitals:   05/31/15 2016 06/01/15 0422  BP: 121/43 128/57  Pulse: 50 51  Temp: 97.9 F (36.6 C) 97.9 F (36.6 C)  Resp: 20 20    Intake/Output Summary (Last 24 hours) at 06/01/15 1232 Last data filed at 06/01/15 1130  Gross per 24 hour  Intake 1558.33 ml  Output      0 ml  Net 1558.33 ml   Filed Weights   05/30/15 0500 05/30/15 1130 06/01/15 0720  Weight: 88.5 kg (195 lb 1.7 oz) 90.674 kg (199 lb 14.4 oz) 91.491 kg (201 lb 11.2 oz)    Exam:  General:  remained afebrile, remarkable improvement since yesterday, AAOX3 today (12/19) and very cooperative. Taking meds and w/o no significant tremors.   Cardiovascular: S1 and s2, no rubs or gallops  Respiratory: CTA bilaterally  Abdomen: soft, NT, ND, positive BS  Musculoskeletal: no edema, no cyanosis   Skin: with right side of her neck with almost complete resolution of erythema and swelling; no pain on palpation   Data Reviewed: Basic Metabolic Panel:  Recent Labs Lab 05/27/15 0650 05/28/15 0145 05/30/15 0405 05/31/15 0417 06/01/15 0418  NA 142 146* 150* 143 141  K 3.8 4.0 3.4* 3.1* 3.4*  CL 107 117* 116* 112* 111  CO2 15* 20* 22 21* 23  GLUCOSE 192* 113* 152* 152* 143*  BUN 40* 28* 25* 18 12  CREATININE 1.46* 1.09* 1.04* 1.01* 1.01*  CALCIUM 10.2 8.8* 9.4 8.9 8.3*  MG  --   --   --  1.4*  --    Liver Function Tests:  Recent Labs Lab 05/27/15 0650  AST 20  ALT 19  ALKPHOS 75  BILITOT 1.1  PROT 7.6  ALBUMIN 4.2   CBC:  Recent Labs Lab 05/27/15 0650 05/28/15 0145 05/30/15 0405 06/01/15 0418  WBC 32.3* 19.4* 9.9 8.3  NEUTROABS 24.9*  --   --   --   HGB 13.5 10.4* 11.0* 10.9*  HCT 40.5 32.4* 34.7* 33.9*  MCV 93.3 96.1 96.4 95.2  PLT 245 191 228 218    Cardiac Enzymes:  Recent Labs Lab 05/27/15 0650 05/27/15 1204 05/27/15 1914 05/28/15 0145  TROPONINI 0.11* 0.10* 0.08* 0.07*    Recent Results (from the past 240 hour(s))  Blood culture (routine x 2)     Status: None (Preliminary result)   Collection Time: 05/27/15  6:50 AM  Result Value Ref Range Status   Specimen Description BLOOD RIGHT FOREARM  Final   Special Requests BOTTLES DRAWN AEROBIC AND ANAEROBIC  Final   Culture   Final    NO GROWTH 4 DAYS Performed at [facility]    Report Status PENDING  Incomplete  Blood culture (routine x 2)     Status: None (Preliminary result)   Collection Time: 05/27/15  7:16 AM  Result Value Ref Range Status   Specimen Description BLOOD RIGHT HAND  Final   Special Requests BOTTLES DRAWN AEROBIC AND ANAEROBIC  Final   Culture   Final    NO GROWTH 4 DAYS Performed at [facility]    Report Status PENDING  Incomplete  Urine culture     Status: None   Collection Time: 05/27/15 10:47 AM  Result Value Ref Range Status   Specimen Description URINE, RANDOM  Final   Special Requests NONE Clindamycin x 2 days  Final   Culture   Final    >=100,000 COLONIES/mL ENTEROCOCCUS SPECIES 60,000 COLONIES/ml PSEUDOMONAS AERUGINOSA Performed at [facility]    Report Status 05/29/2015 FINAL  Final   Organism ID, Bacteria ENTEROCOCCUS SPECIES  Final   Organism ID, Bacteria PSEUDOMONAS AERUGINOSA  Final      Susceptibility   Pseudomonas aeruginosa - MIC*    CEFTAZIDIME 4 SENSITIVE Sensitive     CIPROFLOXACIN <=0.25 SENSITIVE Sensitive     GENTAMICIN 2 SENSITIVE Sensitive     IMIPENEM 1 SENSITIVE Sensitive     PIP/TAZO <=4 SENSITIVE Sensitive     CEFEPIME 2 SENSITIVE Sensitive     * 60,000 COLONIES/ml PSEUDOMONAS AERUGINOSA   Enterococcus species - MIC*    AMPICILLIN <=2 SENSITIVE Sensitive     LEVOFLOXACIN 1 SENSITIVE Sensitive     NITROFURANTOIN <=16 SENSITIVE Sensitive     VANCOMYCIN 1 SENSITIVE Sensitive      * >=100,000 COLONIES/mL ENTEROCOCCUS SPECIES  MRSA PCR Screening     Status: Abnormal   Collection Time: 05/27/15  2:30 PM  Result Value Ref Range Status   MRSA by PCR POSITIVE (A) NEGATIVE Final    Comment:        The GeneXpert MRSA Assay (FDA approved for NASAL specimens only), is one component of a comprehensive MRSA colonization surveillance program. It is not intended to diagnose MRSA infection nor to guide or monitor treatment for MRSA infections. RESULT CALLED TO, READ BACK BY AND VERIFIED WITH: Laqueta Jean RN AT 1725 ON 05/27/15 BY INGLEE      Studies: No results found.  Scheduled Meds: . amantadine  100 mg Oral BID  . antiseptic oral rinse  7 mL Mouth Rinse q12n4p  . aspirin EC  81 mg Oral Daily  . buPROPion  150 mg Oral Daily  . cephALEXin  500 mg Oral 3 times per day  . chlorhexidine  15 mL Mouth Rinse BID  . FLUoxetine  20 mg Oral Daily  . gabapentin  200 mg Oral TID  . heparin  5,000 Units Subcutaneous 3 times per day  . lamoTRIgine  200 mg Oral Daily  . levofloxacin  750 mg Oral Daily  . metoprolol tartrate  12.5 mg Oral BID  . simvastatin  20 mg Oral q1800  . sodium chloride  3 mL Intravenous Q12H   Continuous Infusions: . dextrose 50 mL/hr at 05/31/15 1720    Principal Problem:   Sepsis (HCC) Active Problems:   Bipolar I disorder (HCC)   Essential hypertension   GERD (gastroesophageal reflux disease)   Cellulitis, neck   Tremor   Confusion   AKI (acute kidney injury) (HCC)    Time spent: 35 minutes    Vassie Loll  [facility] Pager [phone]. If 7PM-7AM, please contact night-coverage at [email or web address], password [facility] 06/01/2015, 12:32 PM  LOS: 5 days

## 2015-06-01 NOTE — Care Management Important Message (Signed)
Important Message  Patient Details IM Letter given to Nora/Case Manager to  presented to Patient Name: Alexis Thompson MRN: [id number] Date of Birth: 1941-03-21   Medicare Important Message Given:  Yes    Haskell FlirtJamison, Lyvia Mondesir 06/01/2015, 11:31 AMImportant Message  Patient Details  Name: Alexis Thompson MRN: [id number] Date of Birth: 1941-03-21   Medicare Important Message Given:  Yes    Haskell FlirtJamison, Undrea Archbold 06/01/2015, 11:30 AM

## 2015-06-02 ENCOUNTER — Encounter: Payer: Self-pay | Admitting: Neurology

## 2015-06-02 LAB — BASIC METABOLIC PANEL
Anion gap: 9 (ref 5–15)
BUN: 14 mg/dL (ref 6–20)
CALCIUM: 8.7 mg/dL — AB (ref 8.9–10.3)
CO2: 23 mmol/L (ref 22–32)
CREATININE: 1.24 mg/dL — AB (ref 0.44–1.00)
Chloride: 110 mmol/L (ref 101–111)
GFR, EST AFRICAN AMERICAN: 49 mL/min — AB (ref >60)
GFR, EST NON AFRICAN AMERICAN: 43 mL/min — AB (ref >60)
Glucose, Bld: 130 mg/dL — ABNORMAL HIGH (ref 65–99)
Potassium: 4.4 mmol/L (ref 3.5–5.1)
SODIUM: 142 mmol/L (ref 135–145)

## 2015-06-02 MED ORDER — LORAZEPAM 2 MG/ML IJ SOLN
0.5000 mg | Freq: Once | INTRAMUSCULAR | Status: AC
  Administered 2015-06-02: 0.5 mg via INTRAVENOUS
  Filled 2015-06-02: qty 1

## 2015-06-02 MED ORDER — LEVOFLOXACIN 750 MG PO TABS: 750.0000 mg | ORAL_TABLET | ORAL | Status: DC

## 2015-06-02 NOTE — Progress Notes (Signed)
[facility] PROGRESS NOTE  Alexis Thompson ZOX:[id number] DOB: 04/30/1941 DOA: 05/27/2015 PCP: No PCP Per Patient  Interim Summary 74 y.o. female, who is a nursing home resident with history of undiagnosed early dementia per family, bipolar disorder with suicidal ideation in the past, essential hypertension, dyslipidemia, generalized tremors for the last 2-3 months thought to be due to JordanLatuda side effect, intermittent hallucinations, multiple falls due to tremors and poor cognition who started complaining of neck pain and discomfort 2 days ago, she was started on clindamycin yesterday but developed an allergic reaction to it and was transferred to the ER. Patient found to have sepsis due to UTI and neck cellulitis/parotiditis. Has experience significant improvement and almost get to be discharge on 12/20; but has a rough night and received ativan; which make her to be very somnolent/lethargic and more confused. Also with sitter at bedside for safety. Will try to avoid these meds, continue supportive care and has her discharge to SNF in the next 24-48 hours.  Assessment/Plan: 1. Sepsis due to soft tissue neck infection/parotiditis. CT Neck noted, blood cultures (no growth up to date), had allergic reaction to clindamycin at SNF. -Will switch antibiotics to augmentin (case curbside with ID, Dr. Luciana Axeomer and will follow his rec's for antibiotic therapy) -ENT Dr. Jearld FentonByers has seen patient and recommended warm compresses to affected area; no further intervention anticipated at this point -septic features essentially resolved  -SPL recommending regular diet and thin liquids; will follow closely as her mentation is fluctuating  -patient has episode of agitation and hallucination; ended receiving ativan and today is confused and refusing meds. For safety required to have sitter at bedside  again -following PT evaluation recommendations will discharge to SNF when medically stable.  2. Allergic reaction to  clindamycin. Has received Solu-Medrol and epinephrine in the ER, no signs of lip or tongue swelling, no stridor or wheezing. Stable. -will monitor  -patient with some hoarseness; but continue improving   3. Nonstop tremors. Per family started after she was given latuda few months ago, She appears to have tardive dyskinesia/akathisia on exam -trial of cogentin and neurontin initiated on admission -after discussing with neurology plan is to use neurontin alone; dose adjusted to 200mg  TID trying to  minimize sedative effect -continue use of amantadine   4. Acute Encephalopathy. Likely due to combination of infection and medications. She also has underlying dementia per family, which make her at risk for delirium, she has been hallucinating at the nursing home for the last few weeks prior to admission.  -will continue Holding tramadol, antipsychotics and Tranexene. -will avoid completely if possible narcotics and other sedatives -continue treatment for her infections  -B12 and RPR WNL and neg respectively  -CT head neg for acute abnormalities   5. Bipolar disorder with suicidal ideation in the past.  -Will continue Lamictal, Wellbutrin and Prozac   6. Enterococcus and Pseudomonas UTI: On vancomycin and Zosyn since admission for UTI and neck cellulitis  -patient with incontinence, but voiding properly after foley d/c -will switch to PO abx's; levaquin 750mg  daily and complete antibiotic therapy   7. Mildly elevated troponin. Appears to be due to sepsis, doubt ACS, EKG appears nonacute although very poor baseline due to tremors. -troponin with flat elevation and trending down -2-D echo w/o acute wall motion abnormalities and preserved EF -continue ASA and statins   8. Dyslipidemia. Plan is to continue statins.  9. 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: will  continue use chlorhexidine and mupirocin -patient will continue contact  precaution    11-AKI: due to dehydration and pre-renal azotemia -improved/resolved with IVF's; after fluids reduced and episodes of no drinking properly again, her Cr is elevated to 1.2 -will monitor trend -IVF's rate increase to 75cc/hr  12-hypernatremia: sodium down to 142 range -will continue adjusting rate of IVF's; base on patient ability to maintain hydration  -follow electrolytes intermittently  13-dysphagia  -SPL has reevaluated patient and recommended to advance diet to regular with thin liquids -upright position and aspiration precautions   Code Status: DNR Family Communication: daughter at bedside  Disposition Plan: will monitor for another 24 hours in the hospital; will minimize/avoid any medications that can cause sedation/confusion. Hopefully back to SNF soon   Consultants:  Neurology  ENT  ID (Dr. Luciana Axe curbside)  Procedures:  See below for x-ray reports   Carotid duplex: Bilateral: 1-39% ICA stenosis. Vertebral artery flow is antegrade.   2-D echo: - Left ventricle: The cavity size was normal. Wall thickness was increased in a pattern of mild LVH. Systolic function was normal. The estimated ejection fraction was in the range of 55% to 60%. Wall motion was normal; there were no regional wall motion abnormalities. Left ventricular diastolic function parameters were normal. - Atrial septum: No defect or patent foramen ovale was identified. - Pulmonary arteries: PA peak pressure: 40 mm Hg (S).  Antibiotics:  Vancomycin and zosyn 12/14>>12/19  levaquin 12/19  Augmentin 12/19  HPI/Subjective: Afebrile, AAOX1; somnolent/lethargic and with episode of hallucinations. Neck erythema and swelling completely resolved.   Subjective: Filed Vitals:   06/02/15 0708 06/02/15 1418  BP: 164/93 148/80  Pulse: 53 60  Temp: 98.2 F (36.8 C) 98.2 F (36.8 C)  Resp: 20 18    Intake/Output Summary (Last 24 hours) at 06/02/15 1532 Last data filed  at 06/02/15 1239  Gross per 24 hour  Intake   1103 ml  Output      0 ml  Net   1103 ml   Filed Weights   05/30/15 1130 06/01/15 0720 06/02/15 0500  Weight: 90.674 kg (199 lb 14.4 oz) 91.491 kg (201 lb 11.2 oz) 91.627 kg (202 lb)    Exam:  General:  remained afebrile, patient has step back in her progress and is confused and experiencing hallucinations; oriented to person only. Somnolent after ativan given overnight for agitation.  Cardiovascular: S1 and s2, no rubs or gallops  Respiratory: CTA bilaterally  Abdomen: soft, NT, ND, positive BS  Musculoskeletal: no edema, no cyanosis   Skin: with right side of her neck with complete resolution of erythema and swelling; no pain on palpation   Data Reviewed: Basic Metabolic Panel:  Recent Labs Lab 05/28/15 0145 05/30/15 0405 05/31/15 0417 06/01/15 0418 06/02/15 0424  NA 146* 150* 143 141 142  K 4.0 3.4* 3.1* 3.4* 4.4  CL 117* 116* 112* 111 110  CO2 20* 22 21* 23 23  GLUCOSE 113* 152* 152* 143* 130*  BUN 28* 25* 18 12 14   CREATININE 1.09* 1.04* 1.01* 1.01* 1.24*  CALCIUM 8.8* 9.4 8.9 8.3* 8.7*  MG  --   --  1.4*  --   --    Liver Function Tests:  Recent Labs Lab 05/27/15 0650  AST 20  ALT 19  ALKPHOS 75  BILITOT 1.1  PROT 7.6  ALBUMIN 4.2   CBC:  Recent Labs Lab 05/27/15 0650 05/28/15 0145 05/30/15 0405 06/01/15 0418  WBC 32.3* 19.4* 9.9 8.3  NEUTROABS 24.9*  --   --   --  HGB 13.5 10.4* 11.0* 10.9*  HCT 40.5 32.4* 34.7* 33.9*  MCV 93.3 96.1 96.4 95.2  PLT 245 191 228 218   Cardiac Enzymes:  Recent Labs Lab 05/27/15 0650 05/27/15 1204 05/27/15 1914 05/28/15 0145  TROPONINI 0.11* 0.10* 0.08* 0.07*    Recent Results (from the past 240 hour(s))  Blood culture (routine x 2)     Status: None   Collection Time: 05/27/15  6:50 AM  Result Value Ref Range Status   Specimen Description BLOOD RIGHT FOREARM  Final   Special Requests BOTTLES DRAWN AEROBIC AND ANAEROBIC  Final   Culture   Final     NO GROWTH 5 DAYS Performed at [facility]    Report Status 06/01/2015 FINAL  Final  Blood culture (routine x 2)     Status: None   Collection Time: 05/27/15  7:16 AM  Result Value Ref Range Status   Specimen Description BLOOD RIGHT HAND  Final   Special Requests BOTTLES DRAWN AEROBIC AND ANAEROBIC  Final   Culture   Final    NO GROWTH 5 DAYS Performed at [facility]    Report Status 06/01/2015 FINAL  Final  Urine culture     Status: None   Collection Time: 05/27/15 10:47 AM  Result Value Ref Range Status   Specimen Description URINE, RANDOM  Final   Special Requests NONE Clindamycin x 2 days  Final   Culture   Final    >=100,000 COLONIES/mL ENTEROCOCCUS SPECIES 60,000 COLONIES/ml PSEUDOMONAS AERUGINOSA Performed at [facility]    Report Status 05/29/2015 FINAL  Final   Organism ID, Bacteria ENTEROCOCCUS SPECIES  Final   Organism ID, Bacteria PSEUDOMONAS AERUGINOSA  Final      Susceptibility   Pseudomonas aeruginosa - MIC*    CEFTAZIDIME 4 SENSITIVE Sensitive     CIPROFLOXACIN <=0.25 SENSITIVE Sensitive     GENTAMICIN 2 SENSITIVE Sensitive     IMIPENEM 1 SENSITIVE Sensitive     PIP/TAZO <=4 SENSITIVE Sensitive     CEFEPIME 2 SENSITIVE Sensitive     * 60,000 COLONIES/ml PSEUDOMONAS AERUGINOSA   Enterococcus species - MIC*    AMPICILLIN <=2 SENSITIVE Sensitive     LEVOFLOXACIN 1 SENSITIVE Sensitive     NITROFURANTOIN <=16 SENSITIVE Sensitive     VANCOMYCIN 1 SENSITIVE Sensitive     * >=100,000 COLONIES/mL ENTEROCOCCUS SPECIES  MRSA PCR Screening     Status: Abnormal   Collection Time: 05/27/15  2:30 PM  Result Value Ref Range Status   MRSA by PCR POSITIVE (A) NEGATIVE Final    Comment:        The GeneXpert MRSA Assay (FDA approved for NASAL specimens only), is one component of a comprehensive MRSA colonization surveillance program. It is not intended to diagnose MRSA infection nor to guide or monitor treatment for MRSA  infections. RESULT CALLED TO, READ BACK BY AND VERIFIED WITH: Laqueta Jean RN AT 1725 ON 05/27/15 BY INGLEE      Studies: No results found.  Scheduled Meds: . amantadine  100 mg Oral BID  . amoxicillin-clavulanate  1 tablet Oral Q12H  . antiseptic oral rinse  7 mL Mouth Rinse q12n4p  . aspirin EC  81 mg Oral Daily  . buPROPion  150 mg Oral Daily  . chlorhexidine  15 mL Mouth Rinse BID  . FLUoxetine  20 mg Oral Daily  . gabapentin  200 mg Oral TID  . heparin  5,000 Units Subcutaneous 3 times per day  .  lamoTRIgine  200 mg Oral Daily  . [START ON 06/04/2015] levofloxacin  750 mg Oral Q48H  . metoprolol tartrate  12.5 mg Oral BID  . simvastatin  20 mg Oral q1800  . sodium chloride  3 mL Intravenous Q12H   Continuous Infusions: . dextrose 20 mL/hr at 06/01/15 1231    Principal Problem:   Sepsis (HCC) Active Problems:   Bipolar I disorder (HCC)   Essential hypertension   GERD (gastroesophageal reflux disease)   Cellulitis, neck   Tremor   Confusion   AKI (acute kidney injury) (HCC)    Time spent: 35 minutes    Vassie Loll  [facility] Pager [phone]. If 7PM-7AM, please contact night-coverage at [email or web address], password [facility] 06/02/2015, 3:32 PM  LOS: 6 days

## 2015-06-02 NOTE — Progress Notes (Signed)
CSW continuing to follow.  Pt admitted from [facility] and plans to return when medically ready for discharge.  Per MD, anticipate discharge tomorrow. Pt has had safety sitter which will be discontinued today in order for pt to be without safety sitter for 24 hour prior to pt d/c back to [facility].   CSW notified [facility] insurance regarding anticipation of discharge tomorrow to determine if [facility] will authorize SNF as insurance had discontinued payment prior to pt admission and pt family was paying privately while awaiting Medicaid approval. CSW notified from [facility] that [facility] authorized an initial 7 days for PT at [facility] and then will make further determination once [employer] submits PT notes.   CSW met with pt husband and pt daughter in hallway. CSW provided supportive listening as pt daughter discussed concerns surrounding pt and pt delirium. Pt daughter aware of plan to discontinue sitter in order to plan for discharge to [facility] tomorrow. CSW discussed with pt husband and pt daughter that [facility] has authorized 7 days for PT at [facility] upon pt return. Pt daughter reports that pt family has been working closely with [employer] to develop a long term plan for pt and pt daughter reports that pt has now been approved for SNF Medicaid.   CSW plans to meet with pt daughter tomorrow morning to plan discharge for pt tomorrow back to [facility].   CSW to continue to follow to provide support and assist with pt return to [facility] when pt medically ready for discharge.  Alison Murray, MSW, [place] [employer] [phone]

## 2015-06-02 NOTE — Care Management Note (Signed)
Case Management Note  Patient Details  Name: Alexis Thompson MRN: [id number] Date of Birth: 1940-09-03  Subjective/Objective:      74 yo admitted with sepsis            Action[facility]Plan[facility] and plans to return there.  Expected Discharge Date:   (UNKNOWN)               Expected Discharge Plan:  Skilled Nursing Facility  In-House Referral:  Clinical Social Work  Discharge planning Services  CM Consult  Post Acute Care Choice:    Choice offered to:     DME Arranged:    DME Agency:     HH Arranged:    HH Agency:     Status of Service:  In process, will continue to follow  Medicare Important Message Given:  Yes Date Medicare IM Given:    Medicare IM give by:    Date Additional Medicare IM Given:    Additional Medicare Important Message give by:     If discussed at Long Length of Stay Meetings, dates discussed:    Additional Comments:  Bartholome BillCLEMENTS, Alexis Astorga H, RN 06/02/2015, 2:43 PM

## 2015-06-03 LAB — CBC
HEMATOCRIT: 36.3 % (ref 36.0–46.0)
HEMOGLOBIN: 11.5 g/dL — AB (ref 12.0–15.0)
MCH: 30.7 pg (ref 26.0–34.0)
MCHC: 31.7 g/dL (ref 30.0–36.0)
MCV: 96.8 fL (ref 78.0–100.0)
Platelets: 259 10*3/uL (ref 150–400)
RBC: 3.75 MIL/uL — AB (ref 3.87–5.11)
RDW: 16.5 % — ABNORMAL HIGH (ref 11.5–15.5)
WBC: 8.1 10*3/uL (ref 4.0–10.5)

## 2015-06-03 LAB — BASIC METABOLIC PANEL
Anion gap: 8 (ref 5–15)
BUN: 11 mg/dL (ref 6–20)
CHLORIDE: 109 mmol/L (ref 101–111)
CO2: 24 mmol/L (ref 22–32)
CREATININE: 1.13 mg/dL — AB (ref 0.44–1.00)
Calcium: 9.1 mg/dL (ref 8.9–10.3)
GFR calc Af Amer: 55 mL/min — ABNORMAL LOW (ref >60)
GFR calc non Af Amer: 48 mL/min — ABNORMAL LOW (ref >60)
Glucose, Bld: 147 mg/dL — ABNORMAL HIGH (ref 65–99)
POTASSIUM: 4.3 mmol/L (ref 3.5–5.1)
SODIUM: 141 mmol/L (ref 135–145)

## 2015-06-03 MED ORDER — LEVOFLOXACIN 500 MG PO TABS: 500.0000 mg | ORAL_TABLET | Freq: Every day | ORAL | Status: DC

## 2015-06-03 MED ORDER — GABAPENTIN 100 MG PO CAPS: 200.0000 mg | ORAL_CAPSULE | Freq: Three times a day (TID) | ORAL | Status: DC

## 2015-06-03 MED ORDER — AMOXICILLIN-POT CLAVULANATE 875-125 MG PO TABS: 1.0000 | ORAL_TABLET | Freq: Two times a day (BID) | ORAL | Status: DC

## 2015-06-03 NOTE — Progress Notes (Signed)
Called [facility] to give report. I was transferred twice with no answer. Left a messsage on the supervisor's voicemail for call back.

## 2015-06-03 NOTE — [place] FL2 (Signed)
MEDICAID FL2 LEVEL OF CARE SCREENING TOOL     IDENTIFICATION  Patient Name: Alexis Thompson Birthdate: 1940-10-21 Sex: female Admission Date (Current Location): 05/27/2015  [facility] [place]   Facility and Address:  [facility],  [address], [place] [id number]      Provider Number: [id number]  Attending Physician Name and Address:  Penny Piarlando Vega, MD  Relative Name and Phone Number:       Current Level of Care: Hospital Recommended Level of Care: Skilled Nursing Facility Prior Approval Number:    Date Approved/Denied:   PASRR Number: [id number]  Discharge Plan: SNF    Current Diagnoses: Patient Active Problem List   Diagnosis Date Noted  . AKI (acute kidney injury) (HCC)   . Confusion   . Cellulitis, neck 05/27/2015  . Sepsis (HCC) 05/27/2015  . Tremor 05/27/2015  . Allergic reaction   . Bipolar I disorder (HCC) 04/27/2015  . Drug induced akathisia 04/27/2015  . Essential hypertension 04/27/2015  . Anxiety 04/27/2015  . Hyperlipidemia 04/27/2015  . Neuropathy (HCC) 04/27/2015  . Insomnia 04/27/2015  . GERD (gastroesophageal reflux disease) 04/27/2015  . Overactive bladder 04/27/2015  . Right hand/wrist pain 04/27/2015    Orientation RESPIRATION BLADDER Height & Weight    Self, Situation, Place, Time  Normal Incontinent 5\' 1"  (154.9 cm) 196 lbs.  BEHAVIORAL SYMPTOMS/MOOD NEUROLOGICAL BOWEL NUTRITION STATUS   (no behaviors)  (None) Incontinent Diet (Regular;Thin liquid)  AMBULATORY STATUS COMMUNICATION OF NEEDS Skin   Extensive Assist Verbally Normal                       Personal Care Assistance Level of Assistance  Bathing, Feeding, Dressing Bathing Assistance: Maximum assistance Feeding assistance: Independent Dressing Assistance: Maximum assistance     Functional Limitations Info  Sight, Hearing, Speech Sight Info: Impaired Hearing Info: Adequate Speech Info: Adequate    SPECIAL CARE  FACTORS FREQUENCY  PT (By licensed PT), OT (By licensed OT)     PT Frequency: 5 x a week OT Frequency: 5 x a week            Contractures Contractures Info: Not present    Additional Factors Info  Code Status, Allergies Code Status Info: DNR code status Allergies Info: Clindamycin/lincomycin, Codeine, Glipizide, Tetracyclines & Related     Isolation Precautions Info: 05/27/15 positive MRSA PCR     Current Medications (06/03/2015):  This is the current hospital active medication list Current Facility-Administered Medications  Medication Dose Route Frequency Provider Last Rate Last Dose  . amantadine (SYMMETREL) capsule 100 mg  100 mg Oral BID Leroy SeaPrashant K Singh, MD   100 mg at 06/03/15 [id number]  . amoxicillin-clavulanate (AUGMENTIN) 875-125 MG per tablet 1 tablet  1 tablet Oral Q12H Vassie Lollarlos Madera, MD   1 tablet at 06/03/15 [phone]  . antiseptic oral rinse (CPC / CETYLPYRIDINIUM CHLORIDE 0.05%) solution 7 mL  7 mL Mouth Rinse q12n4p Leroy SeaPrashant K Singh, MD   7 mL at 06/02/15 1600  . aspirin EC tablet 81 mg  81 mg Oral Daily Leroy SeaPrashant K Singh, MD   81 mg at 06/03/15 [id number]  . buPROPion (WELLBUTRIN XL) 24 hr tablet 150 mg  150 mg Oral Daily Leroy SeaPrashant K Singh, MD   150 mg at 06/03/15 [id number]  . chlorhexidine (PERIDEX) 0.12 % solution 15 mL  15 mL Mouth Rinse BID Leroy SeaPrashant K Singh, MD   15 mL at 06/03/15 0939  . dextrose 5 % solution  Intravenous Continuous Vassie Loll, MD 75 mL/hr at 06/02/15 1600    . FLUoxetine (PROZAC) capsule 20 mg  20 mg Oral Daily Leroy Sea, MD   20 mg at 06/03/15 1610  . gabapentin (NEURONTIN) capsule 200 mg  200 mg Oral TID Vassie Loll, MD   200 mg at 06/03/15 9604  . guaiFENesin-dextromethorphan (ROBITUSSIN DM) 100-10 MG/5ML syrup 5 mL  5 mL Oral Q4H PRN Leroy Sea, MD      . heparin injection 5,000 Units  5,000 Units Subcutaneous 3 times per day Leroy Sea, MD   5,000 Units at 06/03/15 [phone]  . hydrALAZINE (APRESOLINE) injection 10 mg  10 mg Intravenous  Q8H PRN Vassie Loll, MD      . lamoTRIgine (LAMICTAL) tablet 200 mg  200 mg Oral Daily Leroy Sea, MD   200 mg at 06/03/15 8119  . [START ON 06/04/2015] levofloxacin (LEVAQUIN) tablet 750 mg  750 mg Oral Q48H Vassie Loll, MD      . metoprolol tartrate (LOPRESSOR) tablet 12.5 mg  12.5 mg Oral BID Vassie Loll, MD   12.5 mg at 06/03/15 1478  . morphine 2 MG/ML injection 1 mg  1 mg Intravenous Q3H PRN Leroy Sea, MD   1 mg at 05/29/15 2109  . ondansetron (ZOFRAN) tablet 4 mg  4 mg Oral Q6H PRN Leroy Sea, MD       Or  . ondansetron (ZOFRAN) injection 4 mg  4 mg Intravenous Q6H PRN Leroy Sea, MD      . simvastatin (ZOCOR) tablet 20 mg  20 mg Oral q1800 Vassie Loll, MD   20 mg at 06/02/15 1745  . sodium chloride 0.9 % injection 3 mL  3 mL Intravenous Q12H Leroy Sea, MD   3 mL at 06/03/15 1000     Discharge Medications: Please see discharge summary for a list of discharge medications.  Relevant Imaging Results:  Relevant Lab Results:   Additional Information SSN: [id number]  Arien Benincasa, Selena Lesser A, LCSW

## 2015-06-03 NOTE — Progress Notes (Signed)
[facility].   CSW facilitated pt discharge needs including contacting facility, [facility] [place] [facility], faxing pt discharge information via [employer], discussing with pt at bedside and pt daughter via telephone, discussed with pt daughter at bedside this morning, providing RN phone number to call report, and arranging ambulance transport via [facility].  Pt daughter requested copy of After Visit Summary (AVS). Copy of AVS sealed in an envelope and placed in [facility] that [facility] to provide to pt daughter. [facility].  No further social work needs identified at this time.  CSW signing off.   Loletta SpecterSuzanna Zanai Mallari, MSW, LCSW Clinical Social Work [phone]

## 2015-06-03 NOTE — Discharge Summary (Addendum)
Physician Discharge Summary  Alexis Thompson WUJ:[id number] DOB: [birth year] DOA: 05/27/2015  PCP: No PCP Per Patient  Admit date: 05/27/2015 Discharge date: 06/03/2015  Time spent: > 35 minutes  Recommendations for Outpatient Follow-up:  1. Pt will be prescribed 7 more days of antibiotics to complete a 14 day treatment total 2. Will also be prescribed 4 days of levaquin to complete a 7 day treatment regimen 3. Addendum: please have pt f/u with urologist for history of incontinence   Discharge Diagnoses:  Principal Problem:   Sepsis (HCC) Active Problems:   Bipolar I disorder (HCC)   Essential hypertension   GERD (gastroesophageal reflux disease)   Cellulitis, neck   Tremor   Confusion   AKI (acute kidney injury) [facility])   Discharge Condition: stable  Diet recommendation: regular diet  Filed Weights   06/01/15 0720 06/02/15 0500 06/03/15 0455  Weight: 91.491 kg (201 lb 11.2 oz) 91.627 kg (202 lb) 92.216 kg (203 lb 4.8 oz)    History of present illness:  Interim Summary 74 y.o. female, who is a nursing home resident with history of undiagnosed early dementia per family, bipolar disorder with suicidal ideation in the past, essential hypertension, dyslipidemia, generalized tremors for the last 2-3 months thought to be due to Jordan side effect, intermittent hallucinations, multiple falls due to tremors and poor cognition who started complaining of neck pain and discomfort 2 days ago, she was started on clindamycin yesterday but developed an allergic reaction to it and was transferred to the ER. Patient found to have sepsis due to UTI and neck cellulitis/parotiditis. Has experience significant improvement and almost get to be discharge on 12/20; but has a rough night and received ativan; which make her to be very somnolent/lethargic and more confused. Also with sitter at bedside for safety.   Hospital Course:  1. Sepsis due to soft tissue neck infection/parotiditis - d/c on augmentin  for another 7 days to complete a total treatment course of 14 days  2. Allergice reaction to clindamycin - will avoid clindamycin  3. Nonstop tremors. 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 Acute Encephalopathy. Likely due to combination of infection and medications. She also has underlying dementia per family, which make her at risk for delirium, she has been hallucinating at the nursing home for the last few weeks prior to admission.  -will continue Holding tramadol, antipsychotics and Tranexene. -will avoid completely if possible narcotics and other sedatives -continue treatment for her infections  -B12 and RPR WNL and neg respectively  -CT head neg for acute abnormalities   5. Enterococcus and pseudomonas UTI - continue levaquin for 5 more days to complete a 7 day treatment course.  For other known comorbidities listed please refer to medication list below.  Procedures:  None  Consultations:  None  Discharge Exam: Filed Vitals:   06/03/15 0455 06/03/15 0806  BP: 156/57 151/69  Pulse: 45 45  Temp: 98.2 F (36.8 C) 98.2 F (36.8 C)  Resp: 18 18    General: Pt in nad, alert and awake Cardiovascular: normal s1 and s2, no rubs Respiratory: no increased wob, no wheezes  Discharge Instructions   Discharge Instructions    Call MD for:  difficulty breathing, headache or visual disturbances  Complete by:  As directed      Call MD for:  severe uncontrolled pain    Complete by:  As directed      Call MD for:  temperature >100.4    Complete by:  As directed      Diet - low sodium heart healthy    Complete by:  As directed      Discharge instructions    Complete by:  As  directed   Please be sure to follow up with your primary care physician in 1 week or sooner at the facility.     Increase activity slowly    Complete by:  As directed           Current Discharge Medication List    START taking these medications   Details  amoxicillin-clavulanate (AUGMENTIN) 875-125 MG tablet Take 1 tablet by mouth every 12 (twelve) hours. Qty: 14 tablet, Refills: 0    levofloxacin (LEVAQUIN) 500 MG tablet Take 1 tablet (500 mg total) by mouth daily. Qty: 5 tablet, Refills: 0      CONTINUE these medications which have CHANGED   Details  gabapentin (NEURONTIN) 100 MG capsule Take 2 capsules (200 mg total) by mouth 3 (three) times daily. Qty: 60 capsule, Refills: 0      CONTINUE these medications which have NOT CHANGED   Details  acetaminophen (TYLENOL) 325 MG tablet Take 650 mg by mouth every 6 (six) hours as needed for headache.     amantadine (SYMMETREL) 100 MG capsule Take 100 mg by mouth 2 (two) times daily.    aspirin EC 81 MG tablet Take 81 mg by mouth daily.    buPROPion (WELLBUTRIN XL) 300 MG 24 hr tablet Take 300 mg by mouth daily.    FLUoxetine (PROZAC) 20 MG capsule Take 20 mg by mouth daily.    lamoTRIgine (LAMICTAL) 200 MG tablet Take 200 mg by mouth daily.    Melatonin 5 MG TABS Take 5 mg by mouth at bedtime.    metoprolol tartrate (LOPRESSOR) 25 MG tablet Take 12.5 mg by mouth 2 (two) times daily. Hold for SBP < 110 and HR <60    omeprazole (PRILOSEC) 40 MG capsule Take 40 mg by mouth daily.    saccharomyces boulardii (FLORASTOR) 250 MG capsule Take 250 mg by mouth 2 (two) times daily.    simvastatin (ZOCOR) 20 MG tablet Take 20 mg by mouth daily.      STOP taking these medications     clorazepate (TRANXENE) 7.5 MG tablet      nystatin (MYCOSTATIN/NYSTOP) 100000 UNIT/GM POWD      oxybutynin (DITROPAN-XL) 10 MG 24 hr tablet      traMADol (ULTRAM) 50 MG tablet      clindamycin (CLEOCIN) 300 MG capsule        Allergies   Allergen Reactions  . Clindamycin/Lincomycin Anaphylaxis  . Codeine Other (See Comments)    Listed on MAR  . Glipizide Other (See Comments)    Listed on MAR  . Tetracyclines & Related Other (See Comments)    Listed on MAR   Follow-up Information    Follow up with [facility] .   Specialty:  Skilled Nursing Facility   Contact information:   [address] [place] [address] [phone]       The results of significant diagnostics from this hospitalization (including imaging, microbiology, ancillary and laboratory) are listed below for reference.    Significant Diagnostic Studies: Dg Chest 1 View  05/12/2015  CLINICAL DATA:  Fever and sore throat. EXAM: CHEST 1 VIEW COMPARISON:  09/03/2013 FINDINGS: A single AP portable view of the chest demonstrates no focal airspace consolidation or alveolar edema. The lungs are grossly clear. There is no large effusion or pneumothorax. Cardiac and mediastinal contours appear unremarkable. IMPRESSION: No active disease. Electronically Signed   By: Ellery Plunk M.D.   On: 05/12/2015 03:55   Ct Head Wo Contrast  05/27/2015  CLINICAL DATA:  74 year old female with profound confusion, agitation. Initial encounter. EXAM: CT HEAD WITHOUT CONTRAST TECHNIQUE: Contiguous axial images were obtained from the base of the skull through the vertex without intravenous contrast. COMPARISON:  Neck CT without contrast 0827 hours today. Head CT 05/12/2015. FINDINGS: Visualized paranasal sinuses and mastoids are clear. No acute osseous abnormality identified. Mild face and scalp edema about the base of the skull. Orbits soft tissues remain normal. Calcified atherosclerosis at the skull base.+++ confluent bilateral cerebral white matter hypodensity appears stable. No midline shift, mass effect, or evidence of intracranial mass lesion. No ventriculomegaly. No acute intracranial hemorrhage identified. No suspicious intracranial vascular  hyperdensity. IMPRESSION: No acute intracranial abnormality. Stable noncontrast CT appearance of the brain with advanced nonspecific white matter changes. Electronically Signed   By: Odessa Fleming M.D.   On: 05/27/2015 13:40   Ct Head Wo Contrast  05/12/2015  CLINICAL DATA:  Visual hallucinations.  No known trauma. EXAM: CT HEAD WITHOUT CONTRAST TECHNIQUE: Contiguous axial images were obtained from the base of the skull through the vertex without intravenous contrast. COMPARISON:  02/09/2015 FINDINGS: There is no intracranial hemorrhage, mass or evidence of acute infarction. There is hemispheric white matter hypodensity consistent with chronic disease. There is moderate generalized atrophy. No interval change is evident. No bone abnormalities evident. The visible paranasal sinuses are clear. IMPRESSION: No acute intracranial findings. There is severe chronic white matter disease and moderate generalized atrophy, without significant interval change. Electronically Signed   By: Ellery Plunk M.D.   On: 05/12/2015 05:10   Ct Soft Tissue Neck Wo Contrast  05/27/2015  CLINICAL DATA:  74 year old female with anaphylaxis and neck swelling EXAM: CT NECK WITHOUT CONTRAST TECHNIQUE: Multidetector CT imaging of the neck was performed following the standard protocol without intravenous contrast. COMPARISON:  Prior CT scan of the head 05/12/2015 prior CT scan of the cervical spine 02/24/2014 FINDINGS: Visualized intracranial contents are unremarkable save for stable cerebral atrophy. The globes and orbits are symmetric and within normal limits bilaterally. Mild reticulation of the subcutaneous fat overlying both parotid glands and extending down to the level of the mandible. Additionally, there is hypo density within the slightly enlarged tongue suggesting the presence of edema. Normal aeration of mastoid air cells and visualized paranasal sinuses. The nasopharyngeal and oro pharyngeal airways remain patent. The epiglottis  is well-defined and not particularly edematous. Perhaps mild edema of the aryepiglottic folds. The subglottic airway is patent. The visualized lung apices are within normal limits. 2 vessel aortic arch. The brachiocephalic and left common carotid artery share a common origin. The great vessels are highly tortuous. The thyroid gland and thoracic inlet are unremarkable. No acute osseous abnormality. Atherosclerotic calcifications are present in the right and left carotid bifurcations. IMPRESSION: 1. No significant narrowing of the airway. 2. Reticulation of the subcutaneous fat of the bilateral cheeks extending from the temporal fossa to the mandible consistent with edema. 3. Suggestion of mild edema of the tongue and aryepiglottic folds. 4. Calcifications in the bilateral carotid artery bifurcations. Consider further evaluation  with dedicated carotid ultrasound to assess for underlying stenosis. Electronically Signed   By: Malachy Moan M.D.   On: 05/27/2015 09:03   Dg Chest Port 1 View  05/27/2015  CLINICAL DATA:  Shortness of breath today. Hypertension. Nonsmoker. EXAM: PORTABLE CHEST 1 VIEW COMPARISON:  05/12/2015; 09/03/2013; 01/22/2013 FINDINGS: Grossly unchanged cardiac silhouette and mediastinal contours given reduced lung volumes. Atherosclerotic plaque within the thoracic aorta. Worsening bilateral infrahilar heterogeneous slightly nodular interstitial opacities. No pleural effusion or pneumothorax. No evidence of edema. No acute osseus abnormalities. IMPRESSION: 1. No definite acute cardiopulmonary disease on this hypoventilated AP portable examination. 2. Decreased lung volumes with worsening bilateral infrahilar opacities favored to represent atelectasis. Electronically Signed   By: Simonne Come M.D.   On: 05/27/2015 07:27   Dg Swallowing Func-speech Pathology  05/29/2015  Objective Swallowing Evaluation:   Patient Details Name: Alexis Thompson MRN: [id number] Date of Birth: 1941-05-15 Today's  Date: 05/29/2015 Time: SLP Start Time (ACUTE ONLY): 1145-SLP Stop Time (ACUTE ONLY): 1220 SLP Time Calculation (min) (ACUTE ONLY): 35 min Past Medical History: Past Medical History Diagnosis Date . Bipolar 1 disorder (HCC)  . Diabetes mellitus without complication (HCC)  . Hypertension  . High cholesterol  . GERD (gastroesophageal reflux disease)  . Depression  . Psychosis  . Suicide attempt by multiple drug overdose [facility])  Past Surgical History: No past surgical history on file. HPI: 74 yo female adm to [facility] with cellulitis due to allergy to clindamycin, PMH + for bipolar disorder, akathisia - medicine induced.  CT neck showed mild lingual and aryepiglottic fold edema.  Pt CXR negative and CT head negative.  She did have a CXR 05/02/15 for fever/sore throat that was also negative.  Swallow evaluation ordered.  Subjective: pt awake in chair, RN and daughter Eunice Blase accompained pt to xray Assessment / Plan / Recommendation CHL IP CLINICAL IMPRESSIONS 05/29/2015 Therapy Diagnosis Mild oral phase dysphagia;Mild pharyngeal phase dysphagia;Suspected primary esophageal dysphagia Clinical Impression Mild oropharyngeal dysphagia with suspected primary esophageal deficits.  Dysphagia characterized decreased oral bolus coordination, impaired propulsion and premature spillage into pharynx.  Pt unable to orally transit barium tablet with pudding, despite multiple efforts.  Tablet expectorated per SlP cue. Pharyngeal swallow characterized by minimal delay in swallow reflex.  Mild pharyngeal residuals with poorly masticated cracker present without pt sensation.  Trace pharyngeal residuals noted with liquids - cued dry swallows beneficial.    No aspiration or laryngeal penetration noted.  Using teach back, live video, educated pt and family to findings/recommendations.  Will follow up for tolerance, readiness for dietary advancement.   Of note, pt did NOT cough during MBS as observed consistently at bedside. Only able to cursory  view proximal esophagus = pt appeared with minimal amount of residuals at proximal esophagus without awareness,  Thin water consumption did not appear to help fully clear.  Pt does report recent issues with sensing food lodging in esophagus x approximately six weeks requiring her to reswallow or expectorate; therefore suspect esophageal component to her dysphagia.  Radiologist not present to confirm findings Impact on safety and function Risk for inadequate nutrition/hydration;Moderate aspiration risk   CHL IP TREATMENT RECOMMENDATION 05/29/2015 Treatment Recommendations Therapy as outlined in treatment plan below   Prognosis 05/29/2015 Prognosis for Safe Diet Advancement Fair Barriers to Reach Goals Cognitive deficits Barriers/Prognosis Comment -- CHL IP DIET RECOMMENDATION 05/29/2015 SLP Diet Recommendations Regular solids;Thin liquid Liquid Administration via Cup;Straw Medication Administration Crushed with puree Compensations Slow rate;Small sips/bites Postural Changes Seated upright at 90 degrees;Remain  semi-upright after after feeds/meals (Comment)   CHL IP OTHER RECOMMENDATIONS 05/29/2015 Recommended Consults Consider esophageal assessment Oral Care Recommendations Oral care BID Other Recommendations Clarify dietary restrictions   CHL IP FOLLOW UP RECOMMENDATIONS 05/29/2015 Follow up Recommendations [facility] D[facility] 05/29/2015 Speech Therapy Frequency (ACUTE ONLY) min 2x/week Treatment Duration 2 weeks      CHL IP ORAL PHASE 05/29/2015 Oral Phase Impaired Oral - Pudding Teaspoon -- Oral - Pudding Cup -- Oral - Honey Teaspoon -- Oral - Honey Cup -- Oral - Nectar Teaspoon Weak lingual manipulation;Premature spillage;Reduced posterior propulsion Oral - Nectar Cup -- Oral - Nectar Straw Premature spillage;Reduced posterior propulsion;Weak lingual manipulation Oral - Thin Teaspoon Premature spillage;Reduced posterior propulsion;Weak lingual manipulation Oral - Thin Cup --  Oral - Thin Straw Weak lingual manipulation;Reduced posterior propulsion;Premature spillage Oral - Puree Weak lingual manipulation;Premature spillage;Reduced posterior propulsion Oral - Mech Soft -- Oral - Regular Weak lingual manipulation;Premature spillage;Reduced posterior propulsion;Impaired mastication;Delayed oral transit;Lingual/palatal residue Oral - Multi-Consistency -- Oral - Pill Weak lingual manipulation;Lingual pumping;Decreased bolus cohesion Oral Phase - Comment --  CHL IP PHARYNGEAL PHASE 05/29/2015 Pharyngeal Phase Impaired Pharyngeal- Pudding Teaspoon -- Pharyngeal -- Pharyngeal- Pudding Cup -- Pharyngeal -- Pharyngeal- Honey Teaspoon -- Pharyngeal -- Pharyngeal- Honey Cup -- Pharyngeal -- Pharyngeal- Nectar Teaspoon Lateral channel residue Pharyngeal -- Pharyngeal- Nectar Cup -- Pharyngeal -- Pharyngeal- Nectar Straw Lateral channel residue Pharyngeal -- Pharyngeal- Thin Teaspoon Lateral channel residue;Delayed swallow initiation-pyriform sinuses Pharyngeal -- Pharyngeal- Thin Cup -- Pharyngeal -- Pharyngeal- Thin Straw Lateral channel residue;Pharyngeal residue - valleculae Pharyngeal -- Pharyngeal- Puree Delayed swallow initiation-vallecula Pharyngeal -- Pharyngeal- Mechanical Soft -- Pharyngeal -- Pharyngeal- Regular Delayed swallow initiation-vallecula;Pharyngeal residue - valleculae;Reduced tongue base retraction Pharyngeal -- Pharyngeal- Multi-consistency -- Pharyngeal -- Pharyngeal- Pill NT Pharyngeal -- Pharyngeal Comment --  CHL IP CERVICAL ESOPHAGEAL PHASE 05/29/2015 Cervical Esophageal Phase Impaired Pudding Teaspoon -- Pudding Cup -- Honey Teaspoon -- Honey Cup -- Nectar Teaspoon -- Nectar Cup -- Nectar Straw -- Thin Teaspoon -- Thin Cup -- Thin Straw -- Puree -- Mechanical Soft -- Regular -- Multi-consistency -- Pill -- Cervical Esophageal Comment Only able to cursory view proximal esophagus = pt appeared with minimal amount of residuals at proximal esophagus without awareness,  Thin  water consumption did not appear to help fully clear.  Pt does report recent issues with sensing food lodging in esophagus x approximately six weeks requiring her to reswallow or expectorate; therefore suspect esophageal component to her dysphagia.  Radiologist not present to confirm findings Donavan Burnetamara Kimball, MS [facility] [phone]               Microbiology: Recent Results (from the past 240 hour(s))  Blood culture (routine x 2)     Status: None   Collection Time: 05/27/15  6:50 AM  Result Value Ref Range Status   Specimen Description BLOOD RIGHT FOREARM  Final   Special Requests BOTTLES DRAWN AEROBIC AND ANAEROBIC 6ML  Final   Culture   Final    NO GROWTH 5 DAYS [facility]    Report Status 06/01/2015 FINAL  Final  Blood culture (routine x 2)     Status: None   Collection Time: 05/27/15  7:16 AM  Result Value Ref Range Status   Specimen Description BLOOD RIGHT HAND  Final   Special Requests BOTTLES DRAWN AEROBIC AND ANAEROBIC 5ML  Final   Culture   Final    NO GROWTH 5 DAYS [facility]    Report Status 06/01/2015 FINAL  Final  Urine culture     Status: None   Collection Time: 05/27/15 10:47 AM  Result Value Ref Range Status   Specimen Description URINE, RANDOM  Final   Special Requests NONE Clindamycin x 2 days  Final   Culture   Final    >=100,000 COLONIES/mL ENTEROCOCCUS SPECIES 60,000 COLONIES/ml PSEUDOMONAS AERUGINOSA Performed at [facility]    Report Status 05/29/2015 FINAL  Final   Organism ID, Bacteria ENTEROCOCCUS SPECIES  Final   Organism ID, Bacteria PSEUDOMONAS AERUGINOSA  Final      Susceptibility   Pseudomonas aeruginosa - MIC*    CEFTAZIDIME 4 SENSITIVE Sensitive     CIPROFLOXACIN <=0.25 SENSITIVE Sensitive     GENTAMICIN 2 SENSITIVE Sensitive     IMIPENEM 1 SENSITIVE Sensitive     PIP/TAZO <=4 SENSITIVE Sensitive     CEFEPIME 2 SENSITIVE Sensitive     * 60,000 COLONIES/ml PSEUDOMONAS AERUGINOSA    Enterococcus species - MIC*    AMPICILLIN <=2 SENSITIVE Sensitive     LEVOFLOXACIN 1 SENSITIVE Sensitive     NITROFURANTOIN <=16 SENSITIVE Sensitive     VANCOMYCIN 1 SENSITIVE Sensitive     * >=100,000 COLONIES/mL ENTEROCOCCUS SPECIES  MRSA PCR Screening     Status: Abnormal   Collection Time: 05/27/15  2:30 PM  Result Value Ref Range Status   MRSA by PCR POSITIVE (A) NEGATIVE Final    Comment:        The GeneXpert MRSA Assay (FDA approved for NASAL specimens only), is one component of a comprehensive MRSA colonization surveillance program. It is not intended to diagnose MRSA infection nor to guide or monitor treatment for MRSA infections. RESULT CALLED TO, READ BACK BY AND VERIFIED WITH: KAAT, J RN AT 1725 ON 05/27/15 BY INGLEE      Labs: Basic Metabolic Panel:  Recent Labs Lab 05/30/15 0405 05/31/15 0417 06/01/15 0418 06/02/15 0424 06/03/15 0415  NA 150* 143 141 142 141  K 3.4* 3.1* 3.4* 4.4 4.3  CL 116* 112* 111 110 109  CO2 22 21* 23 23 24   GLUCOSE 152* 152* 143* 130* 147*  BUN 25* 18 12 14 11   CREATININE 1.04* 1.01* 1.01* 1.24* 1.13*  CALCIUM 9.4 8.9 8.3* 8.7* 9.1  MG  --  1.4*  --   --   --    Liver Function Tests: No results for input(s): AST, ALT, ALKPHOS, BILITOT, PROT, ALBUMIN in the last 168 hours. No results for input(s): LIPASE, AMYLASE in the last 168 hours. No results for input(s): AMMONIA in the last 168 hours. CBC:  Recent Labs Lab 05/28/15 0145 05/30/15 0405 06/01/15 0418 06/03/15 0415  WBC 19.4* 9.9 8.3 8.1  HGB 10.4* 11.0* 10.9* 11.5*  HCT 32.4* 34.7* 33.9* 36.3  MCV 96.1 96.4 95.2 96.8  PLT 191 228 218 259   Cardiac Enzymes:  Recent Labs Lab 05/27/15 1914 05/28/15 0145  TROPONINI 0.08* 0.07*   BNP: BNP (last 3 results) No results for input(s): BNP in the last 8760 hours.  ProBNP (last 3 results) No results for input(s): PROBNP in the last 8760 hours.  CBG: No results for input(s): GLUCAP in the last 168  hours.   Signed:  Penny Pia MD  FACP  [facility] 06/03/2015, 2:42 PM

## 2015-06-03 NOTE — Progress Notes (Signed)
[facility].  Patient's information was given to EMS staff and daughter has a separate packet with patient's medications listed and she is aware of this.  [facility].  Pull-up placed on patient per daughter's request.  Patient's belongings were also given to EMS staff. Nurse [facility] [phone], report was given.

## 2015-06-05 ENCOUNTER — Non-Acute Institutional Stay (SKILLED_NURSING_FACILITY): Payer: Medicare Other | Admitting: Internal Medicine

## 2015-06-05 DIAGNOSIS — N3000 Acute cystitis without hematuria: Secondary | ICD-10-CM | POA: Diagnosis not present

## 2015-06-05 DIAGNOSIS — K1120 Sialoadenitis, unspecified: Secondary | ICD-10-CM | POA: Diagnosis not present

## 2015-06-05 DIAGNOSIS — N183 Chronic kidney disease, stage 3 unspecified: Secondary | ICD-10-CM

## 2015-06-05 DIAGNOSIS — F3170 Bipolar disorder, currently in remission, most recent episode unspecified: Secondary | ICD-10-CM | POA: Diagnosis not present

## 2015-06-05 DIAGNOSIS — R2681 Unsteadiness on feet: Secondary | ICD-10-CM | POA: Diagnosis not present

## 2015-06-05 DIAGNOSIS — K219 Gastro-esophageal reflux disease without esophagitis: Secondary | ICD-10-CM

## 2015-06-05 DIAGNOSIS — R450 Nervousness: Secondary | ICD-10-CM | POA: Diagnosis not present

## 2015-06-05 DIAGNOSIS — I959 Hypotension, unspecified: Secondary | ICD-10-CM | POA: Diagnosis not present

## 2015-06-05 DIAGNOSIS — R682 Dry mouth, unspecified: Secondary | ICD-10-CM | POA: Diagnosis not present

## 2015-06-05 DIAGNOSIS — R5381 Other malaise: Secondary | ICD-10-CM

## 2015-06-05 DIAGNOSIS — R001 Bradycardia, unspecified: Secondary | ICD-10-CM | POA: Diagnosis not present

## 2015-06-05 DIAGNOSIS — G2571 Drug induced akathisia: Secondary | ICD-10-CM

## 2015-06-05 NOTE — Progress Notes (Signed)
Patient ID: Alexis Thompson, female   DOB: August 05, 1940, 74 y.o.   MRN: [id number]     [facility]   PCP: No PCP Per Patient  Code Status: DNR  Allergies  Allergen Reactions  . Clindamycin/Lincomycin Anaphylaxis  . Codeine Other (See Comments)    Listed on MAR  . Glipizide Other (See Comments)    Listed on MAR  . Tetracyclines & Related Other (See Comments)    Listed on [facility]    Chief Complaint  Patient presents with  . Readmit To SNF     HPI:  74 y.o. patient is here for short term rehabilitation post hospital admission from 05/27/15-06/03/15 with acute encephalopathy and sepsis in setting of parotiditis and urinary tract infection. She was also noted to have allergic reaction to clindamycin this admission. She was started on antibiotics. She had worsening of her tremors, was seen by neurology and started on gabapentin. Of note, patient was undergoing rehabilitation in this facility post fall with [place]. She is seen in her room today. She complaints of occasional dizziness with sudden change of position. Her tremors have decreased some. She complaints of dry mouth and sore in her mouth. On review of chart, she is noted to have low blood pressure and heart rate reading  Review of Systems:  Constitutional: Negative for fever, chills, diaphoresis.  HENT: Negative for headache, congestion, nasal discharge, difficulty swallowing. Eyes: Negative for eye pain, blurred vision, double vision and discharge.  Respiratory: positive for dyspnea on exertion. Negative for cough and wheezing.   Cardiovascular: Negative for chest pain, palpitations, leg swelling.  Gastrointestinal: Negative for heartburn, nausea, vomiting, abdominal pain. Had bowel movement yesterday. Genitourinary: Negative for dysuria and increased urinary frequency.  Musculoskeletal: Negative for back pain, falls in the facility. Skin: Negative for itching, rash.  Neurological: Negative for  dizziness, tingling, focal weakness   Past Medical History  Diagnosis Date  . Bipolar 1 disorder (HCC)   . Diabetes mellitus without complication (HCC)   . Hypertension   . High cholesterol   . GERD (gastroesophageal reflux disease)   . Depression   . Psychosis   . Suicide attempt by multiple drug overdose [facility])    Past Surgical History  Procedure Laterality Date  . No past surgeries     Social History:   reports that she has never smoked. She does not have any smokeless tobacco history on file. She reports that she does not drink alcohol or use illicit drugs.  No family history on file.  Medications:   Medication List       This list is accurate as of: 06/05/15  4:44 PM.  Always use your most recent med list.               acetaminophen 325 MG tablet  Commonly known as:  TYLENOL  Take 650 mg by mouth every 6 (six) hours as needed for headache.     amantadine 100 MG capsule  Commonly known as:  SYMMETREL  Take 100 mg by mouth 2 (two) times daily.     amoxicillin-clavulanate 875-125 MG tablet  Commonly known as:  AUGMENTIN  Take 1 tablet by mouth every 12 (twelve) hours.     aspirin EC 81 MG tablet  Take 81 mg by mouth daily.     buPROPion 300 MG 24 hr tablet  Commonly known as:  WELLBUTRIN XL  Take 300 mg by mouth daily.     FLUoxetine 20 MG capsule  Commonly  known as:  PROZAC  Take 20 mg by mouth daily.     gabapentin 100 MG capsule  Commonly known as:  NEURONTIN  Take 2 capsules (200 mg total) by mouth 3 (three) times daily.     lamoTRIgine 200 MG tablet  Commonly known as:  LAMICTAL  Take 200 mg by mouth daily.     levofloxacin 500 MG tablet  Commonly known as:  LEVAQUIN  Take 1 tablet (500 mg total) by mouth daily.     Melatonin 5 MG Tabs  Take 5 mg by mouth at bedtime.     metoprolol tartrate 25 MG tablet  Commonly known as:  LOPRESSOR  Take 12.5 mg by mouth 2 (two) times daily. Hold for SBP < 110 and HR <60     omeprazole 40 MG capsule   Commonly known as:  PRILOSEC  Take 40 mg by mouth daily.     saccharomyces boulardii 250 MG capsule  Commonly known as:  FLORASTOR  Take 250 mg by mouth 2 (two) times daily.     simvastatin 20 MG tablet  Commonly known as:  ZOCOR  Take 20 mg by mouth daily.         Physical Exam: Filed Vitals:   06/05/15 1643  BP: 97/54  Pulse: 45  Temp: 98 F (36.7 C)  Resp: 18  SpO2: 96%    General- elderly female, obese, in no acute distress Head- normocephalic, atraumatic Nose- normal nasal mucosa, no maxillary or frontal sinus tenderness, no nasal discharge Throat- moist mucus membrane, raised bump to her hard palate, no thrush Eyes- PERRLA, EOMI, no pallor, no icterus Neck- no cervical lymphadenopathy Cardiovascular- normal s1,s2, no murmurs, no leg edema Respiratory- bilateral clear to auscultation, no wheeze, no rhonchi, no crackles, no use of accessory muscles Abdomen- bowel sounds present, soft, non tender Musculoskeletal- able to move all 4 extremities, generalized weakness, unsteady gait Neurological- Lolita Riegerakathasia present, alert and oriented to person and place Skin- warm and dry, easy bruising Psychiatry- normal mood and affect    Labs reviewed: Basic Metabolic Panel:  Recent Labs  16/03/9611/18/16 0417 06/01/15 0418 06/02/15 0424 06/03/15 0415  NA 143 141 142 141  K 3.1* 3.4* 4.4 4.3  CL 112* 111 110 109  CO2 21* 23 23 24   GLUCOSE 152* 143* 130* 147*  BUN 18 12 14 11   CREATININE 1.01* 1.01* 1.24* 1.13*  CALCIUM 8.9 8.3* 8.7* 9.1  MG 1.4*  --   --   --    Liver Function Tests:  Recent Labs  05/12/15 0308 05/27/15 0650  AST 21 20  ALT 19 19  ALKPHOS 65 75  BILITOT 0.9 1.1  PROT 7.2 7.6  ALBUMIN 4.4 4.2   No results for input(s): LIPASE, AMYLASE in the last 8760 hours. No results for input(s): AMMONIA in the last 8760 hours. CBC:  Recent Labs  05/27/15 0650  05/30/15 0405 06/01/15 0418 06/03/15 0415  WBC 32.3*  < > 9.9 8.3 8.1  NEUTROABS 24.9*  --    --   --   --   HGB 13.5  < > 11.0* 10.9* 11.5*  HCT 40.5  < > 34.7* 33.9* 36.3  MCV 93.3  < > 96.4 95.2 96.8  PLT 245  < > 228 218 259  < > = values in this interval not displayed. Cardiac Enzymes:  Recent Labs  05/27/15 1204 05/27/15 1914 05/28/15 0145  TROPONINI 0.10* 0.08* 0.07*   BNP: Invalid input(s): POCBNP CBG:  Recent Labs  05/12/15 0352  GLUCAP 97    Radiological Exams: Ct Head Wo Contrast  05/27/2015  CLINICAL DATA:  74 year old female with profound confusion, agitation. Initial encounter. EXAM: CT HEAD WITHOUT CONTRAST TECHNIQUE: Contiguous axial images were obtained from the base of the skull through the vertex without intravenous contrast. COMPARISON:  Neck CT without contrast 0827 hours today. Head CT 05/12/2015. FINDINGS: Visualized paranasal sinuses and mastoids are clear. No acute osseous abnormality identified. Mild face and scalp edema about the base of the skull. Orbits soft tissues remain normal. Calcified atherosclerosis at the skull base.+++ confluent bilateral cerebral white matter hypodensity appears stable. No midline shift, mass effect, or evidence of intracranial mass lesion. No ventriculomegaly. No acute intracranial hemorrhage identified. No suspicious intracranial vascular hyperdensity. IMPRESSION: No acute intracranial abnormality. Stable noncontrast CT appearance of the brain with advanced nonspecific white matter changes. Electronically Signed   By: Odessa Fleming M.D.   On: 05/27/2015 13:40   Ct Soft Tissue Neck Wo Contrast  05/27/2015  CLINICAL DATA:  74 year old female with anaphylaxis and neck swelling EXAM: CT NECK WITHOUT CONTRAST TECHNIQUE: Multidetector CT imaging of the neck was performed following the standard protocol without intravenous contrast. COMPARISON:  Prior CT scan of the head 05/12/2015 prior CT scan of the cervical spine 02/24/2014 FINDINGS: Visualized intracranial contents are unremarkable save for stable cerebral atrophy. The globes  and orbits are symmetric and within normal limits bilaterally. Mild reticulation of the subcutaneous fat overlying both parotid glands and extending down to the level of the mandible. Additionally, there is hypo density within the slightly enlarged tongue suggesting the presence of edema. Normal aeration of mastoid air cells and visualized paranasal sinuses. The nasopharyngeal and oro pharyngeal airways remain patent. The epiglottis is well-defined and not particularly edematous. Perhaps mild edema of the aryepiglottic folds. The subglottic airway is patent. The visualized lung apices are within normal limits. 2 vessel aortic arch. The brachiocephalic and left common carotid artery share a common origin. The great vessels are highly tortuous. The thyroid gland and thoracic inlet are unremarkable. No acute osseous abnormality. Atherosclerotic calcifications are present in the right and left carotid bifurcations. IMPRESSION: 1. No significant narrowing of the airway. 2. Reticulation of the subcutaneous fat of the bilateral cheeks extending from the temporal fossa to the mandible consistent with edema. 3. Suggestion of mild edema of the tongue and aryepiglottic folds. 4. Calcifications in the bilateral carotid artery bifurcations. Consider further evaluation with dedicated carotid ultrasound to assess for underlying stenosis. Electronically Signed   By: Malachy Moan M.D.   On: 05/27/2015 09:03   Dg Chest Port 1 View  05/27/2015  CLINICAL DATA:  Shortness of breath today. Hypertension. Nonsmoker. EXAM: PORTABLE CHEST 1 VIEW COMPARISON:  05/12/2015; 09/03/2013; 01/22/2013 FINDINGS: Grossly unchanged cardiac silhouette and mediastinal contours given reduced lung volumes. Atherosclerotic plaque within the thoracic aorta. Worsening bilateral infrahilar heterogeneous slightly nodular interstitial opacities. No pleural effusion or pneumothorax. No evidence of edema. No acute osseus abnormalities. IMPRESSION: 1. No  definite acute cardiopulmonary disease on this hypoventilated AP portable examination. 2. Decreased lung volumes with worsening bilateral infrahilar opacities favored to represent atelectasis. Electronically Signed   By: Simonne Come M.D.   On: 05/27/2015 07:27     Assessment/Plan   Physical deconditioning Will have her work with physical therapy and occupational therapy team to help with gait training and muscle strengthening exercises.fall precautions. Skin care. Encourage to be out of bed.   Acute parotiditis Improved, afebrile, continue and complete course of augmentin  on 06/08/15  Acute cystitis Asymptomatic, maintain hydration, continue and complete course of levaquin on 06/10/15  Akathisia Continue gabapentin 200 mg tid and amantadine 100 mg bid and monitor  Hypotension Currently on metoprolol and has low bp on review. Continue metoprolol tartrate but decrease it to 12.5 mg daily from bid, have holding parameters and check bp q shift  Bradycardia Asymptomatic. Monitor clinically with vital check q shift. Decreased dose of metoprolol as above  Dry mouth Start biotene mouth wash swish and spit qid prn and monitor. Continue oral hygiene  Unsteady gait Causing Recurrent falls. Will have patient work with PT/OT as tolerated to regain strength and restore function.  Fall precautions are in place.  Bipolar disorder Stable. continue Lamictal 200 mg daily, Prozac 20 mg daily and wellbutrin 300 mg daily for now and monitor.   gerd Continue prilosec 40 mg daily, symptom controlled  ckd stage 3 Monitor bmp   Goals of care: short term rehabilitation   Labs/tests ordered: cbc, cmp 06/08/15  Family/ staff Communication: reviewed care plan with patient and nursing supervisor    Oneal Grout, MD  [facility] [phone] (Monday-Friday 8 am - 5 pm) [phone] (afterhours)

## 2015-06-12 ENCOUNTER — Emergency Department (HOSPITAL_COMMUNITY): Payer: Medicare Other

## 2015-06-12 ENCOUNTER — Encounter (HOSPITAL_COMMUNITY): Payer: Self-pay | Admitting: Nurse Practitioner

## 2015-06-12 ENCOUNTER — Inpatient Hospital Stay (HOSPITAL_COMMUNITY)
Admission: EM | Admit: 2015-06-12 | Discharge: 2015-06-22 | DRG: 056 | Disposition: A | Discharge status: Hospice | Payer: Medicare Other | Attending: Internal Medicine | Admitting: Internal Medicine

## 2015-06-12 DIAGNOSIS — R4182 Altered mental status, unspecified: Secondary | ICD-10-CM | POA: Diagnosis not present

## 2015-06-12 DIAGNOSIS — F09 Unspecified mental disorder due to known physiological condition: Secondary | ICD-10-CM | POA: Diagnosis present

## 2015-06-12 DIAGNOSIS — G252 Other specified forms of tremor: Secondary | ICD-10-CM

## 2015-06-12 DIAGNOSIS — I10 Essential (primary) hypertension: Secondary | ICD-10-CM | POA: Diagnosis present

## 2015-06-12 DIAGNOSIS — K219 Gastro-esophageal reflux disease without esophagitis: Secondary | ICD-10-CM | POA: Diagnosis present

## 2015-06-12 DIAGNOSIS — Z915 Personal history of self-harm: Secondary | ICD-10-CM | POA: Diagnosis not present

## 2015-06-12 DIAGNOSIS — R471 Dysarthria and anarthria: Secondary | ICD-10-CM | POA: Diagnosis present

## 2015-06-12 DIAGNOSIS — F0151 Vascular dementia with behavioral disturbance: Secondary | ICD-10-CM | POA: Diagnosis present

## 2015-06-12 DIAGNOSIS — G253 Myoclonus: Secondary | ICD-10-CM | POA: Diagnosis not present

## 2015-06-12 DIAGNOSIS — Z881 Allergy status to other antibiotic agents status: Secondary | ICD-10-CM

## 2015-06-12 DIAGNOSIS — Z79899 Other long term (current) drug therapy: Secondary | ICD-10-CM | POA: Diagnosis not present

## 2015-06-12 DIAGNOSIS — N179 Acute kidney failure, unspecified: Secondary | ICD-10-CM | POA: Diagnosis present

## 2015-06-12 DIAGNOSIS — G9340 Encephalopathy, unspecified: Secondary | ICD-10-CM | POA: Insufficient documentation

## 2015-06-12 DIAGNOSIS — E876 Hypokalemia: Secondary | ICD-10-CM | POA: Diagnosis not present

## 2015-06-12 DIAGNOSIS — G2571 Drug induced akathisia: Principal | ICD-10-CM | POA: Diagnosis present

## 2015-06-12 DIAGNOSIS — Z7982 Long term (current) use of aspirin: Secondary | ICD-10-CM | POA: Diagnosis not present

## 2015-06-12 DIAGNOSIS — E870 Hyperosmolality and hypernatremia: Secondary | ICD-10-CM | POA: Diagnosis not present

## 2015-06-12 DIAGNOSIS — G259 Extrapyramidal and movement disorder, unspecified: Secondary | ICD-10-CM | POA: Diagnosis present

## 2015-06-12 DIAGNOSIS — R450 Nervousness: Secondary | ICD-10-CM

## 2015-06-12 DIAGNOSIS — Z22322 Carrier or suspected carrier of Methicillin resistant Staphylococcus aureus: Secondary | ICD-10-CM | POA: Diagnosis not present

## 2015-06-12 DIAGNOSIS — E785 Hyperlipidemia, unspecified: Secondary | ICD-10-CM | POA: Diagnosis present

## 2015-06-12 DIAGNOSIS — Z885 Allergy status to narcotic agent status: Secondary | ICD-10-CM | POA: Diagnosis not present

## 2015-06-12 DIAGNOSIS — F319 Bipolar disorder, unspecified: Secondary | ICD-10-CM | POA: Diagnosis not present

## 2015-06-12 DIAGNOSIS — Z515 Encounter for palliative care: Secondary | ICD-10-CM | POA: Diagnosis not present

## 2015-06-12 DIAGNOSIS — R251 Tremor, unspecified: Secondary | ICD-10-CM

## 2015-06-12 DIAGNOSIS — Z66 Do not resuscitate: Secondary | ICD-10-CM | POA: Diagnosis present

## 2015-06-12 DIAGNOSIS — N390 Urinary tract infection, site not specified: Secondary | ICD-10-CM | POA: Diagnosis present

## 2015-06-12 DIAGNOSIS — G92 Toxic encephalopathy: Secondary | ICD-10-CM | POA: Diagnosis present

## 2015-06-12 DIAGNOSIS — E119 Type 2 diabetes mellitus without complications: Secondary | ICD-10-CM | POA: Diagnosis present

## 2015-06-12 DIAGNOSIS — Z888 Allergy status to other drugs, medicaments and biological substances status: Secondary | ICD-10-CM | POA: Diagnosis not present

## 2015-06-12 DIAGNOSIS — E7800 Pure hypercholesterolemia, unspecified: Secondary | ICD-10-CM | POA: Diagnosis present

## 2015-06-12 DIAGNOSIS — R441 Visual hallucinations: Secondary | ICD-10-CM | POA: Diagnosis present

## 2015-06-12 DIAGNOSIS — R410 Disorientation, unspecified: Secondary | ICD-10-CM | POA: Diagnosis not present

## 2015-06-12 LAB — CBC WITH DIFFERENTIAL/PLATELET
BASOS PCT: 1 %
Basophils Absolute: 0.1 10*3/uL (ref 0.0–0.1)
EOS ABS: 0.2 10*3/uL (ref 0.0–0.7)
EOS PCT: 2 %
HCT: 36 % (ref 36.0–46.0)
HEMOGLOBIN: 11.5 g/dL — AB (ref 12.0–15.0)
LYMPHS ABS: 2.1 10*3/uL (ref 0.7–4.0)
Lymphocytes Relative: 27 %
MCH: 30.7 pg (ref 26.0–34.0)
MCHC: 31.9 g/dL (ref 30.0–36.0)
MCV: 96 fL (ref 78.0–100.0)
MONOS PCT: 9 %
Monocytes Absolute: 0.7 10*3/uL (ref 0.1–1.0)
NEUTROS PCT: 61 %
Neutro Abs: 4.6 10*3/uL (ref 1.7–7.7)
PLATELETS: 228 10*3/uL (ref 150–400)
RBC: 3.75 MIL/uL — ABNORMAL LOW (ref 3.87–5.11)
RDW: 16 % — AB (ref 11.5–15.5)
WBC: 7.5 10*3/uL (ref 4.0–10.5)

## 2015-06-12 LAB — COMPREHENSIVE METABOLIC PANEL
ALBUMIN: 3.9 g/dL (ref 3.5–5.0)
ALK PHOS: 57 U/L (ref 38–126)
ALT: 18 U/L (ref 14–54)
AST: 21 U/L (ref 15–41)
Anion gap: 11 (ref 5–15)
BUN: 14 mg/dL (ref 6–20)
CALCIUM: 9.6 mg/dL (ref 8.9–10.3)
CO2: 24 mmol/L (ref 22–32)
CREATININE: 1.3 mg/dL — AB (ref 0.44–1.00)
Chloride: 107 mmol/L (ref 101–111)
GFR calc Af Amer: 46 mL/min — ABNORMAL LOW (ref >60)
GFR calc non Af Amer: 40 mL/min — ABNORMAL LOW (ref >60)
GLUCOSE: 104 mg/dL — AB (ref 65–99)
Potassium: 4 mmol/L (ref 3.5–5.1)
SODIUM: 142 mmol/L (ref 135–145)
Total Bilirubin: 1.1 mg/dL (ref 0.3–1.2)
Total Protein: 6.8 g/dL (ref 6.5–8.1)

## 2015-06-12 LAB — URINALYSIS, ROUTINE W REFLEX MICROSCOPIC
BILIRUBIN URINE: NEGATIVE
GLUCOSE, UA: NEGATIVE mg/dL
Hgb urine dipstick: NEGATIVE
KETONES UR: NEGATIVE mg/dL
Leukocytes, UA: NEGATIVE
Nitrite: NEGATIVE
PH: 5.5 (ref 5.0–8.0)
Protein, ur: NEGATIVE mg/dL
Specific Gravity, Urine: 1.017 (ref 1.005–1.030)

## 2015-06-12 LAB — I-STAT CG4 LACTIC ACID, ED: Lactic Acid, Venous: 1.22 mmol/L (ref 0.5–2.0)

## 2015-06-12 MED ORDER — LAMOTRIGINE 100 MG PO TABS
200.0000 mg | ORAL_TABLET | ORAL | Status: AC
  Filled 2015-06-12: qty 2
  Filled 2015-06-12: qty 1

## 2015-06-12 MED ORDER — LORAZEPAM 2 MG/ML IJ SOLN
1.0000 mg | INTRAMUSCULAR | Status: DC | PRN
  Administered 2015-06-12: 1 mg via INTRAVENOUS
  Administered 2015-06-13: 2 mg via INTRAVENOUS
  Filled 2015-06-12 (×3): qty 1

## 2015-06-12 MED ORDER — MIDAZOLAM HCL 5 MG/ML IJ SOLN: 2.0000 mg | Freq: Once | INTRAMUSCULAR | Status: DC

## 2015-06-12 MED ORDER — AMANTADINE HCL 100 MG PO CAPS
100.0000 mg | ORAL_CAPSULE | ORAL | Status: AC
  Filled 2015-06-12: qty 1

## 2015-06-12 MED ORDER — BUPROPION HCL ER (XL) 150 MG PO TB24
300.0000 mg | ORAL_TABLET | ORAL | Status: AC
  Filled 2015-06-12: qty 2
  Filled 2015-06-12: qty 1

## 2015-06-12 MED ORDER — GABAPENTIN 300 MG PO CAPS
300.0000 mg | ORAL_CAPSULE | Freq: Three times a day (TID) | ORAL | Status: DC
  Administered 2015-06-13 – 2015-06-16 (×8): 300 mg via ORAL
  Filled 2015-06-12 (×9): qty 1

## 2015-06-12 MED ORDER — HALOPERIDOL LACTATE 5 MG/ML IJ SOLN
5.0000 mg | Freq: Once | INTRAMUSCULAR | Status: AC
  Administered 2015-06-12: 5 mg via INTRAMUSCULAR
  Filled 2015-06-12: qty 1

## 2015-06-12 MED ORDER — LIP MEDEX EX OINT
TOPICAL_OINTMENT | Freq: Once | CUTANEOUS | Status: AC
  Administered 2015-06-12: 14:00:00 via TOPICAL
  Filled 2015-06-12: qty 7

## 2015-06-12 MED ORDER — FLUOXETINE HCL 20 MG PO CAPS
20.0000 mg | ORAL_CAPSULE | ORAL | Status: AC
  Filled 2015-06-12 (×2): qty 1

## 2015-06-12 NOTE — ED Notes (Signed)
Alexis SarDebbie Phillips- daughter [phone]

## 2015-06-12 NOTE — ED Notes (Signed)
Patient too combative and agitated to take vital signs. Patient refusing. Daughter at bedside. Dr. Mahala MenghiniSamtani notified.
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
## 2015-06-12 NOTE — Consult Note (Signed)
NEURO HOSPITALIST CONSULT NOTE   Referring physician: Dr Alvino Chapel, ED Reason for Consult: agitated behavior, abnormal movements, visual hallucinations.  HPI:                                                                                                                                          Alexis Thompson is an 74 y.o. female with a past medical history that is pertinent for HTN, HLD, DM, bipolar disorder, progressive cognitive disorder of unclear etiology, recent admission to [facility] due to similar clinical pictura history of abnormal movements for 6 weeks and altered metnal status , brought in by family for further evaluation of the above stated symptoms. Alexis Thompson was formally evaluated by neurology at the time of her recent admission to the hospital and I reviewed the detailed notes from Dr. Leonel Ramsay who suspected multifactorial delirium. In addition, review of her clinical records indicated that she was sen at [facility] in [facility] and there, she was diagnosed with drug-induced akathisia. At the time of her last admission, she was given a trial of gabapentin which resulted in marked improvement of her abnormal movements and family tells me that " she was tremor-free since discharge from the hospital and then she started tremoring again couple of days ago". Daughter said that this time as before they were suspecting an infection as her visual hallucinations and tremors typically got worse when she is harbinger any infection. As per nursing staff, patient was initially very agitated but subsequently has calmed down. Patient said that she can not voluntarily control the movements and is not aware of any precipitating or worsening factors. These abnormal movements are present when she is awake but don't subside during sleep. Denies HA, vertigo, double vision, difficulty swallowing, but complains of having " visual problems" and changes in her speech. She is unable to function due to  a combination of poor cognition, daily visual hallucinations, and tremors. CT head was personally reviewed and showed no acute abnormality. Likewise, I reviewed the available serologies and they are unrevealing except for a slight elevation of Cr 1.30 and UA with moderate leukocytes but negative nitrite.  Past Medical History  Diagnosis Date  . Bipolar 1 disorder ([place])   . Diabetes mellitus without complication ([place])   . Hypertension   . High cholesterol   . GERD (gastroesophageal reflux disease)   . Depression   . Psychosis   . Suicide attempt by multiple drug overdose [facility])     Past Surgical History  Procedure Laterality Date  . No past surgeries      History reviewed. No pertinent family history.  Family History: no movement disorders, epilepsy, or brain tumors   Social History:  reports that she has never smoked. She does not have any smokeless tobacco history  on file. She reports that she does not drink alcohol or use illicit drugs.  Allergies  Allergen Reactions  . Clindamycin/Lincomycin Anaphylaxis  . Codeine Other (See Comments)    Listed on MAR  . Glipizide Other (See Comments)    Listed on MAR  . Tetracyclines & Related Other (See Comments)    Listed on MAR    MEDICATIONS:                                                                                                                     I have reviewed the patient's current medications.   ROS:                                                                                                                                       History obtained from family, chart review and the patient  General ROS: negative for - chills, fatigue, fever, night sweats,  or weight loss Psychological ROS: significant  for - behavioral disorder, hallucinations, memory difficulties, and mood swings  Ophthalmic ROS: negative for -double vision, eye pain or loss of vision ENT ROS: negative for - epistaxis, nasal discharge, oral  lesions, sore throat, tinnitus or vertigo Allergy and Immunology ROS: negative for - hives or itchy/watery eyes Hematological and Lymphatic ROS: negative for - bleeding problems, bruising or swollen lymph nodes Endocrine ROS: negative for - galactorrhea, hair pattern changes, polydipsia/polyuria or temperature intolerance Respiratory ROS: negative for - cough, hemoptysis, shortness of breath or wheezing Cardiovascular ROS: negative for - chest pain, dyspnea on exertion, edema or irregular heartbeat Gastrointestinal ROS: negative for - abdominal pain, diarrhea, hematemesis, nausea/vomiting or stool incontinence Genito-Urinary ROS: negative for - dysuria, hematuria, incontinence or urinary frequency/urgency Musculoskeletal ROS: negative for - joint swelling or muscular weakness Neurological ROS: as noted in HPI Dermatological ROS: negative for rash and skin lesion changes  Physical exam:  Constitutional: obese, pleasant female in no apparent distress. Blood pressure 134/58, pulse 69, temperature 100 F (37.8 C), temperature source Rectal, resp. rate 20, SpO2 94 %. Eyes: no jaundice or exophthalmos.  Head: normocephalic. Neck: supple, no bruits, no JVD. Cardiac: no murmurs. Lungs: clear. Abdomen: soft, no tender, no mass. Extremities: no edema, clubbing, or cyanosis.  Skin: no rash  Neurologic Examination:  General: NAD Mental Status: Alert, oriented, thought content appropriate.  Speech fluent without evidence of aphasia.  Able to follow 3 step commands without difficulty. Cranial Nerves: II: Discs flat bilaterally; Visual fields grossly normal, pupils equal, round, reactive to light and accommodation III,IV, VI: ptosis not present, extra-ocular motions intact bilaterally V,VII: smile symmetric, facial light touch sensation normal bilaterally VIII: hearing normal bilaterally IX,X: uvula  rises symmetrically XI: bilateral shoulder shrug XII: midline tongue extension without atrophy or fasciculations Motor: No frank motor weakness Tone and bulk:normal tone throughout; no atrophy noted Sensory: Pinprick and light touch intact throughout, bilaterally Deep Tendon Reflexes:  1+ all over Plantars: Right: downgoing   Left: downgoing Cerebellar: Prominent resting, kinetic, cerebellar tremor bilaterally. She exhibits a nearly constant, uncontrollable, random, arrhythmic, purposeless hyperkinetic movements involving essentially the whole body.  It is worth noting that at times and for a very brief period of time these movements became almost non existent.  Gait:  No tested due to multiple leads    No results found for: CHOL  Results for orders placed or performed during the hospital encounter of 06/12/15 (from the past 48 hour(s))  Comprehensive metabolic panel     Status: Abnormal   Collection Time: 06/12/15 11:53 AM  Result Value Ref Range   Sodium 142 135 - 145 mmol/L   Potassium 4.0 3.5 - 5.1 mmol/L   Chloride 107 101 - 111 mmol/L   CO2 24 22 - 32 mmol/L   Glucose, Bld 104 (H) 65 - 99 mg/dL   BUN 14 6 - 20 mg/dL   Creatinine, Ser 1.30 (H) 0.44 - 1.00 mg/dL   Calcium 9.6 8.9 - 10.3 mg/dL   Total Protein 6.8 6.5 - 8.1 g/dL   Albumin 3.9 3.5 - 5.0 g/dL   AST 21 15 - 41 U/L   ALT 18 14 - 54 U/L   Alkaline Phosphatase 57 38 - 126 U/L   Total Bilirubin 1.1 0.3 - 1.2 mg/dL   GFR calc non Af Amer 40 (L) >60 mL/min   GFR calc Af Amer 46 (L) >60 mL/min    Comment: (NOTE) The eGFR has been calculated using the CKD EPI equation. This calculation has not been validated in all clinical situations. eGFR's persistently <60 mL/min signify possible Chronic Kidney Disease.    Anion gap 11 5 - 15  CBC with Differential     Status: Abnormal   Collection Time: 06/12/15 11:53 AM  Result Value Ref Range   WBC 7.5 4.0 - 10.5 K/uL   RBC 3.75 (L) 3.87 - 5.11 MIL/uL   Hemoglobin  11.5 (L) 12.0 - 15.0 g/dL   HCT 36.0 36.0 - 46.0 %   MCV 96.0 78.0 - 100.0 fL   MCH 30.7 26.0 - 34.0 pg   MCHC 31.9 30.0 - 36.0 g/dL   RDW 16.0 (H) 11.5 - 15.5 %   Platelets 228 150 - 400 K/uL   Neutrophils Relative % 61 %   Neutro Abs 4.6 1.7 - 7.7 K/uL   Lymphocytes Relative 27 %   Lymphs Abs 2.1 0.7 - 4.0 K/uL   Monocytes Relative 9 %   Monocytes Absolute 0.7 0.1 - 1.0 K/uL   Eosinophils Relative 2 %   Eosinophils Absolute 0.2 0.0 - 0.7 K/uL   Basophils Relative 1 %   Basophils Absolute 0.1 0.0 - 0.1 K/uL  Urinalysis, Routine w reflex microscopic (not at [facility])     Status: None   Collection Time: 06/12/15 11:57 AM  Result  Value Ref Range   Color, Urine YELLOW YELLOW   APPearance CLEAR CLEAR   Specific Gravity, Urine 1.017 1.005 - 1.030   pH 5.5 5.0 - 8.0   Glucose, UA NEGATIVE NEGATIVE mg/dL   Hgb urine dipstick NEGATIVE NEGATIVE   Bilirubin Urine NEGATIVE NEGATIVE   Ketones, ur NEGATIVE NEGATIVE mg/dL   Protein, ur NEGATIVE NEGATIVE mg/dL   Nitrite NEGATIVE NEGATIVE   Leukocytes, UA NEGATIVE NEGATIVE    Comment: MICROSCOPIC NOT DONE ON URINES WITH NEGATIVE PROTEIN, BLOOD, LEUKOCYTES, NITRITE, OR GLUCOSE <1000 mg/dL.  I-Stat CG4 Lactic Acid, ED  (not at [facility])     Status: None   Collection Time: 06/12/15 12:07 PM  Result Value Ref Range   Lactic Acid, Venous 1.22 0.5 - 2.0 mmol/L    Dg Chest 2 View  06/12/2015  CLINICAL DATA:  Sepsis. EXAM: CHEST  2 VIEW COMPARISON:  May 27, 2015. FINDINGS: The heart size and mediastinal contours are within normal limits. Hypoinflation of the lungs is noted. Probable minimal bibasilar subsegmental atelectasis is noted. No pneumothorax or pleural effusion is noted. The visualized skeletal structures are unremarkable. IMPRESSION: Hypoinflation of the lungs with minimal bibasilar subsegmental atelectasis. Electronically Signed   By: Marijo Conception, M.D.   On: 06/12/2015 12:40   Ct Head Wo Contrast  06/12/2015  CLINICAL DATA:  Altered  mental status. EXAM: CT HEAD WITHOUT CONTRAST TECHNIQUE: Contiguous axial images were obtained from the base of the skull through the vertex without intravenous contrast. COMPARISON:  CT scan of May 27, 2015. FINDINGS: Bony calvarium appears intact. Moderate chronic ischemic white matter disease is noted. No mass effect or midline shift is noted. Ventricular size is within normal limits. There is no evidence of mass lesion, hemorrhage or acute infarction. IMPRESSION: Moderate chronic ischemic white matter disease. No acute intracranial abnormality seen. Electronically Signed   By: Marijo Conception, M.D.   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 She needs further neuro-work up but her abnormal movements will be an issue to complete MRI and EEG. In the meantime, will suggest increasing the dose of gabapentin hoping to achieve better movement control but I am afraid she will need more aggressive management with addition of atypical antipsychotics if no contraindications. Recommended transfer to [facility] for closer neuro follow up during the weekend.  Dorian Pod, MD 06/12/2015, 4:09 PM  [facility]

## 2015-06-12 NOTE — ED Notes (Signed)
[facility] called. 

## 2015-06-12 NOTE — ED Provider Notes (Addendum)
CSN: [id number]     Arrival date & time 06/12/15  1124 History   First MD Initiated Contact with Patient 06/12/15 1214     Chief Complaint  Patient presents with  . Altered Mental Status      Patient is a 74 y.o. female presenting with altered mental status. The history is provided by the patient.  Altered Mental Status  patient presents with altered mental status. Reportedly has been more combative at the nursing home. Apparently had recent episodes of the same that may have been from the latuda. Also is been treated for UTI and infection in her parotid gland. Reportedly has been combative, which is not her baseline. She had been tremor free movement disorder free when she left the hospital a week ago. The movements have returned.  Past Medical History  Diagnosis Date  . Bipolar 1 disorder ([facility])   . Diabetes mellitus without complication ([facility])   . Hypertension   . High cholesterol   . GERD (gastroesophageal reflux disease)   . Depression   . Psychosis   . Suicide attempt by multiple drug overdose [facility])    Past Surgical History  Procedure Laterality Date  . No past surgeries     History reviewed. No pertinent family history. Social History  Substance Use Topics  . Smoking status: Never Smoker   . Smokeless tobacco: None  . Alcohol Use: No   OB History    No data available     Review of Systems  Unable to perform ROS: Mental status change      Allergies  Clindamycin/lincomycin; Codeine; Glipizide; and Tetracyclines & related  Home Medications   Prior to Admission medications   Medication Sig Start Date End Date Taking? Authorizing Provider  acetaminophen (TYLENOL) 325 MG tablet Take 650 mg by mouth every 6 (six) hours as needed for headache.     Historical Provider, MD  amantadine (SYMMETREL) 100 MG capsule Take 100 mg by mouth 2 (two) times daily.    Historical Provider, MD  amoxicillin-clavulanate (AUGMENTIN) 875-125 MG tablet Take 1 tablet by mouth every 12  (twelve) hours. 06/03/15   Penny Pia, MD  aspirin EC 81 MG tablet Take 81 mg by mouth daily.    Historical Provider, MD  buPROPion (WELLBUTRIN XL) 300 MG 24 hr tablet Take 300 mg by mouth daily.    Historical Provider, MD  FLUoxetine (PROZAC) 20 MG capsule Take 20 mg by mouth daily.    Historical Provider, MD  gabapentin (NEURONTIN) 100 MG capsule Take 2 capsules (200 mg total) by mouth 3 (three) times daily. 06/03/15   Penny Pia, MD  lamoTRIgine (LAMICTAL) 200 MG tablet Take 200 mg by mouth daily.    Historical Provider, MD  levofloxacin (LEVAQUIN) 500 MG tablet Take 1 tablet (500 mg total) by mouth daily. 06/04/15   Penny Pia, MD  Melatonin 5 MG TABS Take 5 mg by mouth at bedtime.    Historical Provider, MD  metoprolol tartrate (LOPRESSOR) 25 MG tablet Take 12.5 mg by mouth 2 (two) times daily. Hold for SBP < 110 and HR <60    Historical Provider, MD  omeprazole (PRILOSEC) 40 MG capsule Take 40 mg by mouth daily.    Historical Provider, MD  saccharomyces boulardii (FLORASTOR) 250 MG capsule Take 250 mg by mouth 2 (two) times daily.    Historical Provider, MD  simvastatin (ZOCOR) 20 MG tablet Take 20 mg by mouth daily.    Historical Provider, MD   BP 142/62 mmHg  Pulse 74  Temp(Src) 100 F (37.8 C) (Rectal)  Resp 16  SpO2 94% Physical Exam  Constitutional: She appears well-developed.  HENT:  Head: Atraumatic.  Eyes: EOM are normal.  Cardiovascular: Normal rate.   Pulmonary/Chest: Effort normal.  Abdominal: Soft.  Musculoskeletal: Normal range of motion.  Neurological:  Some confusion. Able answer some questions for me. Patient has diffuse tremors. Some difficulty moving her arms and legs.  Skin: Skin is warm.    ED Course  Procedures (including critical care time) Labs Review Labs Reviewed  COMPREHENSIVE METABOLIC PANEL - Abnormal; Notable for the following:    Glucose, Bld 104 (*)    Creatinine, Ser 1.30 (*)    GFR calc non Af Amer 40 (*)    GFR calc Af Amer 46 (*)     All other components within normal limits  CBC WITH DIFFERENTIAL/PLATELET - Abnormal; Notable for the following:    RBC 3.75 (*)    Hemoglobin 11.5 (*)    RDW 16.0 (*)    All other components within normal limits  CULTURE, BLOOD (ROUTINE X 2)  CULTURE, BLOOD (ROUTINE X 2)  URINE CULTURE  URINALYSIS, ROUTINE W REFLEX MICROSCOPIC (NOT AT [facility])  I-STAT CG4 LACTIC ACID, ED  I-STAT CG4 LACTIC ACID, ED    Imaging Review Dg Chest 2 View  06/12/2015  CLINICAL DATA:  Sepsis. EXAM: CHEST  2 VIEW COMPARISON:  May 27, 2015. FINDINGS: The heart size and mediastinal contours are within normal limits. Hypoinflation of the lungs is noted. Probable minimal bibasilar subsegmental atelectasis is noted. No pneumothorax or pleural effusion is noted. The visualized skeletal structures are unremarkable. IMPRESSION: Hypoinflation of the lungs with minimal bibasilar subsegmental atelectasis. Electronically Signed   By: Lupita RaiderJames  Green Jr, M.D.   On: 06/12/2015 12:40   Ct Head Wo Contrast  06/12/2015  CLINICAL DATA:  Altered mental status. EXAM: CT HEAD WITHOUT CONTRAST TECHNIQUE: Contiguous axial images were obtained from the base of the skull through the vertex without intravenous contrast. COMPARISON:  CT scan of May 27, 2015. FINDINGS: Bony calvarium appears intact. Moderate chronic ischemic white matter disease is noted. No mass effect or midline shift is noted. Ventricular size is within normal limits. There is no evidence of mass lesion, hemorrhage or acute infarction. IMPRESSION: Moderate chronic ischemic white matter disease. No acute intracranial abnormality seen. Electronically Signed   By: Lupita RaiderJames  Green Jr, M.D.   On: 06/12/2015 13:54   I have personally reviewed and evaluated these images and lab results as part of my medical decision-making.   EKG Interpretation None      MDM   Final diagnoses:  Tremor    Patient with somewhat worsening confusion and having some hallucinations but  also return of her movement issues. Had resolved. Has been on Neurontin 200 mg 3 times a day. Seen by neurology, who recommends inpatient [facility].    Benjiman CoreNathan Meliyah Simon, MD 06/12/15 [phone]  Patient has rectal temperature of 100. Urine and chest x-ray reassuring. No nausea vomiting.  Benjiman CoreNathan Amna Welker, MD 06/12/15 [phone]

## 2015-06-12 NOTE — ED Notes (Signed)
Patient observed to have urinated but she refuses to be cleaned up at this time. Family ok'd. Patient requests to rest currently and agrees to let us clean her when she is being transferred to [facility].

## 2015-06-12 NOTE — ED Notes (Signed)
Dr. Mahala MenghiniSamtani at bedside. Ok'd patient to eat regular diet at this time.

## 2015-06-12 NOTE — ED Notes (Signed)
Patient transported to X-ray 

## 2015-06-12 NOTE — ED Notes (Signed)
Bed: [id number] Expected date:  Expected time:  Means of arrival:  Comments: Ems-combative

## 2015-06-12 NOTE — ED Notes (Signed)
Patient arrives via [facility] with family and staff complaints of worsening combative behavior while at her place of residence, [address]. Patient was last normal 3 days ago. Family states that this behavior also occurred around 12/14 also which they accounted to an adverse reaction to JordanLatuda. She was removed from that medication and returned to normal until now. Patient will open her eyes but will not follow commands which is a change from her baseline. At baseline she is reportedly not combative. PTAR was also called to this case and performed the initial assessment of the [facility] at [facility] and reported that her pupils were not symmetrical. She was also observed to be twitching and shaking in her arms and jaw. EMS reports her speech is clear. Patient has history of multiple strokes.

## 2015-06-12 NOTE — Progress Notes (Signed)
Spoke with staff at [facility]  [phone] to confirmed pt dr following her is Dr Daisy LazarM Pandey [facility] updated Cm answered questions for family (female and female) at bedside who voiced interest in getting pt to another snf closer to home Discussed that the SNF SW generally assist with placement changes Pt may be assisted at [facility] if she is admitted inpatient Reports pt initially went to [facility] for rehab, deteriorated, was hospitalized and went back for more rehab but has deteriorated again per female  Updated ED RN on CM visit

## 2015-06-12 NOTE — ED Notes (Signed)
Pt is refusing VS at this time and also refusing all other care. Daughter is in room and is aware.

## 2015-06-12 NOTE — ED Notes (Signed)
Patient will receive MRI with [facility] at [name] once she is transferred there. Sp[facility] with MRI [facility] to verify.
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
## 2015-06-13 ENCOUNTER — Other Ambulatory Visit (HOSPITAL_COMMUNITY): Payer: Medicare Other

## 2015-06-13 ENCOUNTER — Encounter (HOSPITAL_COMMUNITY): Payer: Self-pay | Admitting: Anesthesiology

## 2015-06-13 DIAGNOSIS — G253 Myoclonus: Secondary | ICD-10-CM | POA: Insufficient documentation

## 2015-06-13 DIAGNOSIS — R4182 Altered mental status, unspecified: Secondary | ICD-10-CM | POA: Insufficient documentation

## 2015-06-13 DIAGNOSIS — G9340 Encephalopathy, unspecified: Secondary | ICD-10-CM

## 2015-06-13 LAB — URINE CULTURE: Culture: NO GROWTH

## 2015-06-13 LAB — COMPREHENSIVE METABOLIC PANEL
ALT: 18 U/L (ref 14–54)
ANION GAP: 13 (ref 5–15)
AST: 25 U/L (ref 15–41)
Albumin: 3.5 g/dL (ref 3.5–5.0)
Alkaline Phosphatase: 54 U/L (ref 38–126)
BUN: 12 mg/dL (ref 6–20)
CHLORIDE: 107 mmol/L (ref 101–111)
CO2: 24 mmol/L (ref 22–32)
CREATININE: 1.29 mg/dL — AB (ref 0.44–1.00)
Calcium: 9.8 mg/dL (ref 8.9–10.3)
GFR, EST AFRICAN AMERICAN: 47 mL/min — AB (ref >60)
GFR, EST NON AFRICAN AMERICAN: 40 mL/min — AB (ref >60)
Glucose, Bld: 92 mg/dL (ref 65–99)
POTASSIUM: 3.8 mmol/L (ref 3.5–5.1)
SODIUM: 144 mmol/L (ref 135–145)
Total Bilirubin: 1.1 mg/dL (ref 0.3–1.2)
Total Protein: 6.1 g/dL — ABNORMAL LOW (ref 6.5–8.1)

## 2015-06-13 LAB — GLUCOSE, CAPILLARY
GLUCOSE-CAPILLARY: 83 mg/dL (ref 65–99)
GLUCOSE-CAPILLARY: 92 mg/dL (ref 65–99)
GLUCOSE-CAPILLARY: 89 mg/dL (ref 65–99)
GLUCOSE-CAPILLARY: 86 mg/dL (ref 65–99)

## 2015-06-13 LAB — CBC
HEMATOCRIT: 36.1 % (ref 36.0–46.0)
HEMOGLOBIN: 11.5 g/dL — AB (ref 12.0–15.0)
MCH: 30.6 pg (ref 26.0–34.0)
MCHC: 31.9 g/dL (ref 30.0–36.0)
MCV: 96 fL (ref 78.0–100.0)
PLATELETS: 218 10*3/uL (ref 150–400)
RBC: 3.76 MIL/uL — AB (ref 3.87–5.11)
RDW: 16 % — ABNORMAL HIGH (ref 11.5–15.5)
WBC: 7.5 10*3/uL (ref 4.0–10.5)

## 2015-06-13 LAB — PROTIME-INR
INR: 1.27 (ref 0.00–1.49)
Prothrombin Time: 16 seconds — ABNORMAL HIGH (ref 11.6–15.2)

## 2015-06-13 LAB — MRSA PCR SCREENING: MRSA BY PCR: POSITIVE — AB

## 2015-06-13 MED ORDER — PANTOPRAZOLE SODIUM 40 MG PO TBEC
40.0000 mg | DELAYED_RELEASE_TABLET | Freq: Every day | ORAL | Status: DC
  Administered 2015-06-13 – 2015-06-19 (×5): 40 mg via ORAL
  Filled 2015-06-13 (×7): qty 1

## 2015-06-13 MED ORDER — SODIUM CHLORIDE 0.9 % IJ SOLN
3.0000 mL | Freq: Two times a day (BID) | INTRAMUSCULAR | Status: DC
  Administered 2015-06-13 – 2015-06-21 (×4): 3 mL via INTRAVENOUS

## 2015-06-13 MED ORDER — DIVALPROEX SODIUM 500 MG PO DR TAB
500.0000 mg | DELAYED_RELEASE_TABLET | Freq: Two times a day (BID) | ORAL | Status: DC
  Administered 2015-06-13 – 2015-06-15 (×3): 500 mg via ORAL
  Filled 2015-06-13 (×3): qty 1

## 2015-06-13 MED ORDER — BUPROPION HCL ER (XL) 150 MG PO TB24
300.0000 mg | ORAL_TABLET | Freq: Every day | ORAL | Status: DC
  Administered 2015-06-13 – 2015-06-14 (×2): 300 mg via ORAL
  Filled 2015-06-13 (×2): qty 2

## 2015-06-13 MED ORDER — FLUOXETINE HCL 20 MG PO CAPS
20.0000 mg | ORAL_CAPSULE | Freq: Every day | ORAL | Status: DC
  Administered 2015-06-13 – 2015-06-16 (×4): 20 mg via ORAL
  Filled 2015-06-13 (×4): qty 1

## 2015-06-13 MED ORDER — ASPIRIN EC 81 MG PO TBEC
81.0000 mg | DELAYED_RELEASE_TABLET | Freq: Every day | ORAL | Status: DC
  Administered 2015-06-13 – 2015-06-16 (×4): 81 mg via ORAL
  Filled 2015-06-13 (×7): qty 1

## 2015-06-13 MED ORDER — SODIUM CHLORIDE 0.9 % IV SOLN
INTRAVENOUS | Status: DC
  Administered 2015-06-13 – 2015-06-14 (×2): via INTRAVENOUS

## 2015-06-13 MED ORDER — LAMOTRIGINE 100 MG PO TABS
200.0000 mg | ORAL_TABLET | Freq: Every day | ORAL | Status: DC
  Administered 2015-06-13 – 2015-06-16 (×4): 200 mg via ORAL
  Filled 2015-06-13 (×7): qty 2

## 2015-06-13 MED ORDER — GABAPENTIN 100 MG PO CAPS: 200.0000 mg | ORAL_CAPSULE | Freq: Three times a day (TID) | ORAL | Status: DC

## 2015-06-13 MED ORDER — ENOXAPARIN SODIUM 40 MG/0.4ML ~~LOC~~ SOLN
40.0000 mg | SUBCUTANEOUS | Status: DC
  Administered 2015-06-13 – 2015-06-18 (×5): 40 mg via SUBCUTANEOUS
  Filled 2015-06-13 (×6): qty 0.4

## 2015-06-13 MED ORDER — AMANTADINE HCL 100 MG PO CAPS
100.0000 mg | ORAL_CAPSULE | Freq: Two times a day (BID) | ORAL | Status: DC
  Administered 2015-06-13 – 2015-06-16 (×5): 100 mg via ORAL
  Filled 2015-06-13 (×8): qty 1

## 2015-06-13 MED ORDER — METOPROLOL TARTRATE 12.5 MG HALF TABLET
12.5000 mg | ORAL_TABLET | Freq: Two times a day (BID) | ORAL | Status: DC
  Administered 2015-06-13 – 2015-06-19 (×7): 12.5 mg via ORAL
  Filled 2015-06-13 (×11): qty 1

## 2015-06-13 NOTE — Progress Notes (Signed)
Subjective: Patient was somnolent but could be aroused briefly. She has continued to have involuntary movements of extremities and face, as well as neck and trunk. She's also continued to be confused and somewhat agitated when aroused.  Objective: Current vital signs: BP 133/49 mmHg  Pulse 54  Temp(Src) 99.5 F (37.5 C) (Axillary)  Resp 20  Ht   Wt 86.1 kg (189 lb 13.1 oz)  SpO2 97%  Neurologic Exam: Patient had very frequent clonic its of her head and neck as well as extremities independently. He was arousable and knew her correct age and current month. Speech was moderately garbled. Extraocular movements were full and conjugate. Face was symmetrical. Muscle tone at rest was flaccid. No clear fasciculations of extremity muscles were seen. Deep tendon reflexes were 1+ and symmetrical. Plantar responses were mute.  Medications: I have reviewed the patient's current medications.  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 Etiology is unclear. MRI of her brain is pending. EEG is also pending.  I will give her a trial of valproic acid for myoclonus, 100 mg every 12 hours. Lumbar puncture may also be done, depending on EEG and MRI results.  C.R. Roseanne RenoStewart, MD [facility] [phone]  06/13/2015  5:01 PM

## 2015-06-13 NOTE — Progress Notes (Signed)
[facility] PROGRESS NOTE  Elaria Osias ZOX:[id number] DOB: Jul 04, 1940 DOA: 06/12/2015 PCP: Oneal Grout, MD  Assessment/Plan: 1. Bilateral tremors/acute encephalopathy -Abdomen members had reported patient having abnormal movements/tremors for the past 6 weeks that has been associate with mental status changes. She had a recent hospitalization at [facility] where CT scan of brain was unremarkable. -At discharge from her last hospitalization she was given trial gabapentin that had resulted in improvement to tremors. -Family reporting that tremors came back several days ago -Unclear underlying infectious processes precipitating abnormal movements or if there is a primary underlying neurologic condition. Medications also a consideration. -She was seen and evaluated by neurology. Dr Cyril Mourning recommended transfer to [facility] to undergo further workup with EEG and MRI. -Patient unable to remain still for MRI for which she will require sedation. I consulted anesthesia, plan for MRI tomorrow. -She remains on amantadine 100 mg twice a day, gabapentin 300 mg 3 times a day, Lamictal 200 mg daily and Wellbutrin 300 mg by mouth daily -Await further recommendations from neurology.  2. History of sepsis.  -She was recently hospitalized for sepsis in setting of urinary tract infection and parotiditis, discharged on Augmentin which she completed. -Initial labs showing a normal white count of 7500, lactic acid of 1.22, urinalysis negative, with chest x-ray not showing acute consolidation. -No indication for antimicrobial therapy, as there is not an identifiable source of infection to explain tremors/encephalopathy.  3.  Bipolar disorder -For now will continue Lamictal and Wellbutrin.  4.  Acute kidney injury. -Initial labs showing creatinine of 1.3, increased from 1.1 on 06/03/2015. Likely secondary to prerenal azotemia. -Continue IV fluids, repeat labs in am  Code Status:  Full code Family Communication: I spoke with her daughter Gracy Racer. Family would like to get updates, call [phone] Anthony Sar Disposition Plan: Pending MRI and EEG   Consultants:  Neurology   HPI/Subjective: Mrs. Hamid is a 74 year old female with a past medical history of bipolar disorder, progressive cognitive disorder, having recent hospitalization at [facility] from 05/27/2015 through 06/03/2015 where she was treated for sepsis in setting of peritonitis/urinary tract infection. At the time she presented with acute encephalopathy. During that hospitalization she was evaluated by Dr. Amada Jupiter of neurology suspecting delirium. Presented to the emergency department at [facility] with complaints of worsening tremors associated with agitation, visual hallucinations. He was seen and evaluated by neurology at [facility] who recommended transfer to [facility] for further workup.  Objective: Filed Vitals:   06/13/15 0950 06/13/15 1354  BP: 133/100 133/49  Pulse: 72 54  Temp: 99 F (37.2 C) 99.5 F (37.5 C)  Resp: 22 20    Intake/Output Summary (Last 24 hours) at 06/13/15 1553 Last data filed at 06/13/15 1020  Gross per 24 hour  Intake    240 ml  Output      0 ml  Net    240 ml   Filed Weights   06/13/15 0544  Weight: 86.1 kg (189 lb 13.1 oz)    Exam:   General:  Patient is somnolent however arousable, minimally verbal. Upon arousal she had significant bilateral upper extremity tremors.  Cardiovascular: Regular rate and rhythm normal S1-S2  Respiratory: Normal respiratory effort, lungs are clear to auscultation bilaterally  Abdomen: Soft nontender nondistended  Musculoskeletal: No edema  Neurological: She has bilateral upper extremity tremors, lethargic, however is arousable. Can follow simple commands. She has significant tremors, particularly with movement. Upper extremities more involved.    Data  Reviewed: Basic Metabolic  Panel:  Recent Labs Lab 06/12/15 1153 06/13/15 0408  NA 142 144  K 4.0 3.8  CL 107 107  CO2 24 24  GLUCOSE 104* 92  BUN 14 12  CREATININE 1.30* 1.29*  CALCIUM 9.6 9.8   Liver Function Tests:  Recent Labs Lab 06/12/15 1153 06/13/15 0408  AST 21 25  ALT 18 18  ALKPHOS 57 54  BILITOT 1.1 1.1  PROT 6.8 6.1*  ALBUMIN 3.9 3.5   No results for input(s): LIPASE, AMYLASE in the last 168 hours. No results for input(s): AMMONIA in the last 168 hours. CBC:  Recent Labs Lab 06/12/15 1153 06/13/15 0408  WBC 7.5 7.5  NEUTROABS 4.6  --   HGB 11.5* 11.5*  HCT 36.0 36.1  MCV 96.0 96.0  PLT 228 218   Cardiac Enzymes: No results for input(s): CKTOTAL, CKMB, CKMBINDEX, TROPONINI in the last 168 hours. BNP (last 3 results) No results for input(s): BNP in the last 8760 hours.  ProBNP (last 3 results) No results for input(s): PROBNP in the last 8760 hours.  CBG:  Recent Labs Lab 06/12/15 2256 06/13/15 0635 06/13/15 1113  GLUCAP 83 92 89    Recent Results (from the past 240 hour(s))  Culture, blood (routine x 2)     Status: None (Preliminary result)   Collection Time: 06/12/15 11:52 AM  Result Value Ref Range Status   Specimen Description BLOOD BLOOD RIGHT HAND  Final   Special Requests BOTTLES DRAWN AEROBIC AND ANAEROBIC 5CC  Final   Culture   Final    NO GROWTH < 24 HOURS Performed at [facility]    Report Status PENDING  Incomplete  Culture, blood (routine x 2)     Status: None (Preliminary result)   Collection Time: 06/12/15 11:52 AM  Result Value Ref Range Status   Specimen Description BLOOD BLOOD LEFT HAND  Final   Special Requests IN PEDIATRIC BOTTLE 1CC  Final   Culture   Final    NO GROWTH < 24 HOURS Performed at [facility]    Report Status PENDING  Incomplete  Urine culture     Status: None   Collection Time: 06/12/15 11:57 AM  Result Value Ref Range Status   Specimen Description URINE, CATHETERIZED  Final   Special Requests NONE   Final   Culture   Final    NO GROWTH 1 DAY Performed at [facility]    Report Status 06/13/2015 FINAL  Final  MRSA PCR Screening     Status: Abnormal   Collection Time: 06/13/15  4:08 AM  Result Value Ref Range Status   MRSA by PCR POSITIVE (A) NEGATIVE Final    Comment:        The GeneXpert MRSA Assay (FDA approved for NASAL specimens only), is one component of a comprehensive MRSA colonization surveillance program. It is not intended to diagnose MRSA infection nor to guide or monitor treatment for MRSA infections. RESULT CALLED TO, READ BACK BY AND VERIFIED WITH: J [facility]  06/13/15 MKELLY      Studies: Dg Chest 2 View  06/12/2015  CLINICAL DATA:  Sepsis. EXAM: CHEST  2 VIEW COMPARISON:  May 27, 2015. FINDINGS: The heart size and mediastinal contours are within normal limits. Hypoinflation of the lungs is noted. Probable minimal bibasilar subsegmental atelectasis is noted. No pneumothorax or pleural effusion is noted. The visualized skeletal structures are unremarkable. IMPRESSION: Hypoinflation of the lungs with minimal bibasilar subsegmental atelectasis. Electronically Signed  By: Lupita RaiderJames  Green Jr, M.D.   On: 06/12/2015 12:40   Ct Head Wo Contrast  06/12/2015  CLINICAL DATA:  Altered mental status. EXAM: CT HEAD WITHOUT CONTRAST TECHNIQUE: Contiguous axial images were obtained from the base of the skull through the vertex without intravenous contrast. COMPARISON:  CT scan of May 27, 2015. FINDINGS: Bony calvarium appears intact. Moderate chronic ischemic white matter disease is noted. No mass effect or midline shift is noted. Ventricular size is within normal limits. There is no evidence of mass lesion, hemorrhage or acute infarction. IMPRESSION: Moderate chronic ischemic white matter disease. No acute intracranial abnormality seen. Electronically Signed   By: Lupita RaiderJames  Green Jr, M.D.   On: 06/12/2015 13:54    Scheduled Meds: . amantadine  100 mg Oral BID   . amantadine  100 mg Oral NOW  . aspirin EC  81 mg Oral Daily  . buPROPion  300 mg Oral Daily  . buPROPion  300 mg Oral NOW  . enoxaparin (LOVENOX) injection  40 mg Subcutaneous Q24H  . FLUoxetine  20 mg Oral Daily  . FLUoxetine  20 mg Oral NOW  . gabapentin  300 mg Oral TID  . lamoTRIgine  200 mg Oral Daily  . lamoTRIgine  200 mg Oral NOW  . metoprolol tartrate  12.5 mg Oral BID  . midazolam  2 mg Intravenous Once  . pantoprazole  40 mg Oral Daily  . sodium chloride  3 mL Intravenous Q12H   Continuous Infusions:   Active Problems:   Tremor   Acathisia    Time spent: 35 min    Jeralyn BennettZAMORA, Muhamad Serano  [facility] Pager [phone]. If 7PM-7AM, please contact night-coverage at [email or web address], password [email or web address] 06/13/2015, 3:53 PM  LOS: 1 day

## 2015-06-13 NOTE — Anesthesia Preprocedure Evaluation (Deleted)
Anesthesia Evaluation  Patient identified by MRN, date of birth, ID band Patient confused    Reviewed: Allergy & Precautions, H&P , NPO status , Patient's Chart, lab work & pertinent test results  History of Anesthesia Complications Negative for: history of anesthetic complications  Airway Mallampati: II  TM Distance: >3 FB Neck ROM: full    Dental no notable dental hx.    Pulmonary neg pulmonary ROS,    Pulmonary exam normal breath sounds clear to auscultation       Cardiovascular hypertension, negative cardio ROS Normal cardiovascular exam Rhythm:regular Rate:Normal     Neuro/Psych Anxiety Depression Bipolar Disorder Schizophrenia Tremors , myoclonus. Abn movements . Altered mental status  Neuromuscular disease negative neurological ROS     GI/Hepatic Neg liver ROS, GERD  ,  Endo/Other  negative endocrine ROSdiabetes  Renal/GU negative Renal ROS     Musculoskeletal   Abdominal   Peds  Hematology negative hematology ROS (+)   Anesthesia Other Findings   Reproductive/Obstetrics negative OB ROS                          Anesthesia Physical Anesthesia Plan  ASA: IV  Anesthesia Plan: General   Post-op Pain Management:    Induction: Intravenous  Airway Management Planned: Oral ETT  Additional Equipment: None  Intra-op Plan:   Post-operative Plan: Extubation in OR  Informed Consent: I have reviewed the patients History and Physical, chart, labs and discussed the procedure including the risks, benefits and alternatives for the proposed anesthesia with the patient or authorized representative who has indicated his/her understanding and acceptance.   Dental Advisory Given  Plan Discussed with: Anesthesiologist and CRNA  Anesthesia Plan Comments: (MRI likely postponed until 06/14/15 (tomorrow) given schedule of availability in MRI today. Family has been updated and is aware.)        Anesthesia Quick Evaluation

## 2015-06-14 ENCOUNTER — Encounter (HOSPITAL_COMMUNITY)
Admission: EM | Disposition: A | Discharge status: Hospice | Payer: Self-pay | Source: Home / Self Care | Attending: Internal Medicine

## 2015-06-14 ENCOUNTER — Inpatient Hospital Stay (HOSPITAL_COMMUNITY): Payer: Medicare Other

## 2015-06-14 DIAGNOSIS — G9340 Encephalopathy, unspecified: Secondary | ICD-10-CM | POA: Insufficient documentation

## 2015-06-14 DIAGNOSIS — G253 Myoclonus: Secondary | ICD-10-CM

## 2015-06-14 HISTORY — PX: RADIOLOGY WITH ANESTHESIA: SHX6223

## 2015-06-14 LAB — CBC
HCT: 36 % (ref 36.0–46.0)
HEMOGLOBIN: 11.6 g/dL — AB (ref 12.0–15.0)
MCH: 31.4 pg (ref 26.0–34.0)
MCHC: 32.2 g/dL (ref 30.0–36.0)
MCV: 97.6 fL (ref 78.0–100.0)
PLATELETS: 192 10*3/uL (ref 150–400)
RBC: 3.69 MIL/uL — AB (ref 3.87–5.11)
RDW: 16.1 % — ABNORMAL HIGH (ref 11.5–15.5)
WBC: 5.4 10*3/uL (ref 4.0–10.5)

## 2015-06-14 LAB — BASIC METABOLIC PANEL
ANION GAP: 10 (ref 5–15)
BUN: 13 mg/dL (ref 6–20)
CALCIUM: 9.5 mg/dL (ref 8.9–10.3)
CHLORIDE: 111 mmol/L (ref 101–111)
CO2: 26 mmol/L (ref 22–32)
CREATININE: 1.22 mg/dL — AB (ref 0.44–1.00)
GFR calc non Af Amer: 43 mL/min — ABNORMAL LOW (ref >60)
GFR, EST AFRICAN AMERICAN: 50 mL/min — AB (ref >60)
Glucose, Bld: 79 mg/dL (ref 65–99)
Potassium: 3.7 mmol/L (ref 3.5–5.1)
SODIUM: 147 mmol/L — AB (ref 135–145)

## 2015-06-14 SURGERY — RADIOLOGY WITH ANESTHESIA
Anesthesia: Choice

## 2015-06-14 MED ORDER — WHITE PETROLATUM GEL
Status: AC
  Administered 2015-06-14: 13:00:00
  Filled 2015-06-14: qty 1

## 2015-06-14 MED ORDER — ONDANSETRON HCL 4 MG/2ML IJ SOLN: 4.0000 mg | Freq: Once | INTRAMUSCULAR | Status: DC | PRN

## 2015-06-14 MED ORDER — FENTANYL CITRATE (PF) 100 MCG/2ML IJ SOLN: 25.0000 ug | INTRAMUSCULAR | Status: DC | PRN

## 2015-06-14 NOTE — Progress Notes (Signed)
Subjective: Patient had no new complaints. She is continued to have involuntary myoclonic-like movements of extremities.  Objective: Current vital signs: BP 113/65 mmHg  Pulse 55  Temp(Src) 98.4 F (36.9 C) (Axillary)  Resp 15  Ht   Wt 86.1 kg (189 lb 13.1 oz)  SpO2 96%  Neurologic Exam: Patient is sleeping but is easily aroused. She was oriented and in no acute distress. Myoclonic movements were less intense and involved her extremities and trunk primarily, with minimal lower extremity movements and no twitches her face. Muscle tone remains flaccid at rest.  Medications: I have reviewed the patient's current medications.  Assessment/Plan: [age] year old lady with reported decline in functioning well as ascending myoclonic activity over several months. Myoclonus is less intense today than yesterday with no involvement of her face and minimal old of lower extremities. Myoclonic jerks of upper extremities also less intense yesterday. Etiology is unclear. MRI is still pending. EEG has been ordered and is pending as well.  No changes in current management recommended. Depakote 500 mg twice a day will continue.  C.R. Roseanne RenoStewart, MD [facility] [phone]  06/14/2015  9:25 AM

## 2015-06-14 NOTE — Progress Notes (Signed)
[facility] PROGRESS NOTE  Alexis Thompson UXL:[id number] DOB: 1940/10/07 DOA: 06/12/2015 PCP: Oneal GroutPANDEY, MAHIMA, MD  Assessment/Plan: 1. Bilateral tremors/acute encephalopathy -Abdomen members had reported patient having abnormal movements/tremors for the past 6 weeks that has been associate with mental status changes. She had a [facility] where CT scan of brain was unremarkable. -At discharge from her last hospitalization she was given trial gabapentin that had resulted in improvement to tremors. -Family reporting that tremors came back several days ago -Unclear underlying infectious processes precipitating abnormal movements or if there is a primary underlying neurologic condition. Medications also a consideration. -She was seen and evaluated by neurology. Dr Cyril Mourningamillo [facility] to undergo further workup with EEG and MRI. -Patient unable to remain still for MRI for which she will require sedation. I consulted anesthesia, plan for MRI on 06/14/2015 -She remains on amantadine 100 mg twice a day, gabapentin 300 mg 3 times a day, Lamictal 200 mg daily and Wellbutrin 300 mg by mouth daily -Neurology recommending Depakote 500 mg PO BID. There seems to be improvement on this morning's evaluation with tremors/jerks less intense. She also seems little more awake and alert. Will await MRI and EEG results.    2. History of sepsis.  -She was recently hospitalized for sepsis in setting of urinary tract infection and parotiditis, discharged on Augmentin which she completed. -Initial labs showing a normal white count of 7500, lactic acid of 1.22, urinalysis negative, with chest x-ray not showing acute consolidation. -No indication for antimicrobial therapy, as there is not an identifiable source of infection to explain tremors/encephalopathy.  3.  Bipolar disorder -For now will continue Lamictal and Wellbutrin.  4.  Acute kidney  injury. -Initial labs showing creatinine of 1.3, increased from 1.1 on 06/03/2015. Likely secondary to prerenal azotemia. -Repeat labs on 06/14/2015 showing creatinine of 1.22. She is NPO for procedure. Will continue IV fluids.   Code Status: Full code Family Communication: I spoke with her daughter Anthony SarDebbie Phillips to give update. Her telephone # [phone]. Eunice BlaseDebbie is contact person and wishes to be updated daily.   Disposition Plan: Pending MRI and EEG  Consultants:  Neurology  HPI/Subjective: [name] is a 75 year old female with a past medical history of bipolar disorder, progressive cognitive disorder, having [facility] through 06/03/2015 where she was treated for sepsis in setting of peritonitis/urinary tract infection. At the time she presented with acute encephalopathy. During that hospitalization she was evaluated by Dr. Amada JupiterKirkpatrick of neurology suspecting delirium. [facility] complaints of worsening tremors associated with agitation, visual hallucinations. He was [facility] and [facility].  Objective: Filed Vitals:   06/14/15 0125 06/14/15 0526  BP: 142/82 113/65  Pulse: 53 55  Temp: 97.7 F (36.5 C) 98.4 F (36.9 C)  Resp: 16 15    Intake/Output Summary (Last 24 hours) at 06/14/15 0939 Last data filed at 06/13/15 1020  Gross per 24 hour  Intake      0 ml  Output      0 ml  Net      0 ml   Filed Weights   06/13/15 0544  Weight: 86.1 kg (189 lb 13.1 oz)    Exam:   General:  Patient is awake, seems to be more verbal.   Cardiovascular: Regular rate and rhythm normal S1-S2  Respiratory: Normal respiratory  effort, lungs are clear to auscultation bilaterally  Abdomen: Soft nontender nondistended  Musculoskeletal: No edema  Neurological: She has bilateral upper extremity tremors,  lethargic, however is arousable. Can follow simple commands. She has tremors, particularly with movement. Upper extremities mostly involved. I think there may be some improvement today.    Data Reviewed: Basic Metabolic Panel:  Recent Labs Lab 06/12/15 1153 06/13/15 0408 06/14/15 0230  NA 142 144 147*  K 4.0 3.8 3.7  CL 107 107 111  CO2 24 24 26   GLUCOSE 104* 92 79  BUN 14 12 13   CREATININE 1.30* 1.29* 1.22*  CALCIUM 9.6 9.8 9.5   Liver Function Tests:  Recent Labs Lab 06/12/15 1153 06/13/15 0408  AST 21 25  ALT 18 18  ALKPHOS 57 54  BILITOT 1.1 1.1  PROT 6.8 6.1*  ALBUMIN 3.9 3.5   No results for input(s): LIPASE, AMYLASE in the last 168 hours. No results for input(s): AMMONIA in the last 168 hours. CBC:  Recent Labs Lab 06/12/15 1153 06/13/15 0408 06/14/15 0230  WBC 7.5 7.5 5.4  NEUTROABS 4.6  --   --   HGB 11.5* 11.5* 11.6*  HCT 36.0 36.1 36.0  MCV 96.0 96.0 97.6  PLT 228 218 192   Cardiac Enzymes: No results for input(s): CKTOTAL, CKMB, CKMBINDEX, TROPONINI in the last 168 hours. BNP (last 3 results) No results for input(s): BNP in the last 8760 hours.  ProBNP (last 3 results) No results for input(s): PROBNP in the last 8760 hours.  CBG:  Recent Labs Lab 06/12/15 2256 06/13/15 0635 06/13/15 1113 06/13/15 1619  GLUCAP 83 92 89 86    Recent Results (from the past 240 hour(s))  Culture, blood (routine x 2)     Status: None (Preliminary result)   Collection Time: 06/12/15 11:52 AM  Result Value Ref Range Status   Specimen Description BLOOD BLOOD RIGHT HAND  Final   Special Requests BOTTLES DRAWN AEROBIC AND ANAEROBIC 5CC  Final   Culture   Final    NO GROWTH < 24 HOURS Performed at [facility]    Report Status PENDING  Incomplete  Culture, blood (routine x 2)     Status: None (Preliminary result)   Collection Time: 06/12/15 11:52 AM  Result Value Ref Range Status   Specimen Description BLOOD BLOOD LEFT HAND  Final   Special  Requests IN PEDIATRIC BOTTLE 1CC  Final   Culture   Final    NO GROWTH < 24 HOURS Performed at [facility]    Report Status PENDING  Incomplete  Urine culture     Status: None   Collection Time: 06/12/15 11:57 AM  Result Value Ref Range Status   Specimen Description URINE, CATHETERIZED  Final   Special Requests NONE  Final   Culture   Final    NO GROWTH 1 DAY Performed at [facility]    Report Status 06/13/2015 FINAL  Final  MRSA PCR Screening     Status: Abnormal   Collection Time: 06/13/15  4:08 AM  Result Value Ref Range Status   MRSA by PCR POSITIVE (A) NEGATIVE Final    Comment:        The GeneXpert MRSA Assay (FDA approved for NASAL specimens only), is one component of a comprehensive MRSA colonization surveillance program. It is not intended to diagnose MRSA infection nor to guide or monitor treatment for MRSA infections. RESULT CALLED TO, READ BACK BY AND VERIFIED WITH: J STELMAN @0554  06/13/15 MKELLY  Studies: Dg Chest 2 View  06/12/2015  CLINICAL DATA:  Sepsis. EXAM: CHEST  2 VIEW COMPARISON:  May 27, 2015. FINDINGS: The heart size and mediastinal contours are within normal limits. Hypoinflation of the lungs is noted. Probable minimal bibasilar subsegmental atelectasis is noted. No pneumothorax or pleural effusion is noted. The visualized skeletal structures are unremarkable. IMPRESSION: Hypoinflation of the lungs with minimal bibasilar subsegmental atelectasis. Electronically Signed   By: Lupita Raider, M.D.   On: 06/12/2015 12:40   Ct Head Wo Contrast  06/12/2015  CLINICAL DATA:  Altered mental status. EXAM: CT HEAD WITHOUT CONTRAST TECHNIQUE: Contiguous axial images were obtained from the base of the skull through the vertex without intravenous contrast. COMPARISON:  CT scan of May 27, 2015. FINDINGS: Bony calvarium appears intact. Moderate chronic ischemic white matter disease is noted. No mass effect or midline shift is noted.  Ventricular size is within normal limits. There is no evidence of mass lesion, hemorrhage or acute infarction. IMPRESSION: Moderate chronic ischemic white matter disease. No acute intracranial abnormality seen. Electronically Signed   By: Lupita Raider, M.D.   On: 06/12/2015 13:54    Scheduled Meds: . amantadine  100 mg Oral BID  . aspirin EC  81 mg Oral Daily  . buPROPion  300 mg Oral Daily  . divalproex  500 mg Oral Q12H  . enoxaparin (LOVENOX) injection  40 mg Subcutaneous Q24H  . FLUoxetine  20 mg Oral Daily  . gabapentin  300 mg Oral TID  . lamoTRIgine  200 mg Oral Daily  . metoprolol tartrate  12.5 mg Oral BID  . midazolam  2 mg Intravenous Once  . pantoprazole  40 mg Oral Daily  . sodium chloride  3 mL Intravenous Q12H   Continuous Infusions: . sodium chloride 100 mL/hr at 06/13/15 1824    Active Problems:   Tremor   Acathisia   Altered mental state   Myoclonus    Time spent: 25 min    Jeralyn Bennett  [facility] Pager [phone]. If 7PM-7AM, please contact night-coverage at [email or web address], password [facility] 06/14/2015, 9:39 AM  LOS: 2 days

## 2015-06-15 ENCOUNTER — Inpatient Hospital Stay (HOSPITAL_COMMUNITY)
Admit: 2015-06-15 | Discharge: 2015-06-15 | Disposition: A | Discharge status: Home / Self Care | Payer: Medicare Other | Attending: Family Medicine | Admitting: Family Medicine

## 2015-06-15 DIAGNOSIS — F319 Bipolar disorder, unspecified: Secondary | ICD-10-CM

## 2015-06-15 LAB — BASIC METABOLIC PANEL
Anion gap: 13 (ref 5–15)
BUN: 13 mg/dL (ref 6–20)
CO2: 23 mmol/L (ref 22–32)
CREATININE: 1.25 mg/dL — AB (ref 0.44–1.00)
Calcium: 9.3 mg/dL (ref 8.9–10.3)
Chloride: 109 mmol/L (ref 101–111)
GFR, EST AFRICAN AMERICAN: 49 mL/min — AB (ref >60)
GFR, EST NON AFRICAN AMERICAN: 42 mL/min — AB (ref >60)
Glucose, Bld: 82 mg/dL (ref 65–99)
Potassium: 3.5 mmol/L (ref 3.5–5.1)
SODIUM: 145 mmol/L (ref 135–145)

## 2015-06-15 LAB — VALPROIC ACID LEVEL: Valproic Acid Lvl: 38 ug/mL — ABNORMAL LOW (ref 50.0–100.0)

## 2015-06-15 LAB — CBC
HCT: 35.4 % — ABNORMAL LOW (ref 36.0–46.0)
Hemoglobin: 11.2 g/dL — ABNORMAL LOW (ref 12.0–15.0)
MCH: 30.4 pg (ref 26.0–34.0)
MCHC: 31.6 g/dL (ref 30.0–36.0)
MCV: 96.2 fL (ref 78.0–100.0)
PLATELETS: 179 10*3/uL (ref 150–400)
RBC: 3.68 MIL/uL — AB (ref 3.87–5.11)
RDW: 15.8 % — AB (ref 11.5–15.5)
WBC: 5.1 10*3/uL (ref 4.0–10.5)

## 2015-06-15 LAB — TSH: TSH: 0.831 u[IU]/mL (ref 0.350–4.500)

## 2015-06-15 LAB — CK: CK TOTAL: 93 U/L (ref 38–234)

## 2015-06-15 LAB — AMMONIA: Ammonia: 40 umol/L — ABNORMAL HIGH (ref 9–35)

## 2015-06-15 MED ORDER — CLONAZEPAM 0.5 MG PO TABS
0.5000 mg | ORAL_TABLET | Freq: Every day | ORAL | Status: DC
  Administered 2015-06-16: 0.5 mg via ORAL
  Filled 2015-06-15: qty 1

## 2015-06-15 MED ORDER — CLONAZEPAM 1 MG PO TABS
2.5000 mg | ORAL_TABLET | Freq: Two times a day (BID) | ORAL | Status: DC
  Administered 2015-06-15 – 2015-06-16 (×2): 2.5 mg via ORAL
  Filled 2015-06-15 (×2): qty 1

## 2015-06-15 MED ORDER — CLONAZEPAM 1 MG PO TABS: 2.5000 mg | ORAL_TABLET | Freq: Every day | ORAL | Status: DC
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
## 2015-06-15 NOTE — Progress Notes (Signed)
EEG completed, results pending. 

## 2015-06-15 NOTE — Procedures (Signed)
ELECTROENCEPHALOGRAM REPORT   Patient: Alexis Thompson        Age: 75 y.o.        Sex: female Referring Physician: Dr Lavon PaganiniNandigam Report Date:  06/15/2015        Interpreting Physician: Omelia BlackwaterSUMNER, Cindee Mclester JUSTIN  History: Alexis Thompson is an 75 y.o. female with progressive decline and altered mental status  Medications:  I have reviewed the patient's current medications.  Conditions of Recording:  This is a 16 channel EEG carried out with the patient in the drowsy state.  Description:  EEG technically limited due to near continuous muscle artifact with a frontal predominance. The background activity consists of a very low voltage, symmetrical, fairly well organized slow activity predominantly in the theta range, seen from the parieto-occipital and posterior temporal regions. No focal slowing or epileptiform activity is noted. .  Hyperventilation was not performed. Intermittent photic stimulation was not performed   IMPRESSION: Abnormal EEG due to the presence of generalized slowing indicating a moderate to severe cerebral disturbance (encephalopathy). No epileptiform activity noted.    Elspeth Choeter Janique Hoefer, DO [facility] [phone]  If 7pm- 7am, please page neurology on call as listed in AMION. 06/15/2015, 11:33 AM

## 2015-06-15 NOTE — Progress Notes (Signed)
Subjective: Patient with generalized abnormal myoclonic movements in her limbs.  No new neurological complaints.   Exam: Filed Vitals:   06/15/15 0548 06/15/15 0929  BP: 155/60 156/70  Pulse: 60 63  Temp: 98.6 F (37 C) 98.1 F (36.7 C)  Resp: 18 20   Current Facility-Administered Medications  Medication Dose Route Frequency Provider Last Rate Last Dose  . amantadine (SYMMETREL) capsule 100 mg  100 mg Oral BID Rhetta Mura, MD   100 mg at 06/14/15 2104  . aspirin EC tablet 81 mg  81 mg Oral Daily Rhetta Mura, MD   81 mg at 06/14/15 1850  . buPROPion (WELLBUTRIN XL) 24 hr tablet 300 mg  300 mg Oral Daily Rhetta Mura, MD   300 mg at 06/14/15 1849  . divalproex (DEPAKOTE) DR tablet 500 mg  500 mg Oral Q12H Charles Stewart   500 mg at 06/14/15 2104  . enoxaparin (LOVENOX) injection 40 mg  40 mg Subcutaneous Q24H Rhetta Mura, MD   40 mg at 06/14/15 1019  . fentaNYL (SUBLIMAZE) injection 25-50 mcg  25-50 mcg Intravenous Q5 min PRN Karlyne Greenspan, MD      . FLUoxetine (PROZAC) capsule 20 mg  20 mg Oral Daily Rhetta Mura, MD   20 mg at 06/14/15 1850  . gabapentin (NEURONTIN) capsule 300 mg  300 mg Oral TID Lunette Stands, MD   300 mg at 06/14/15 2104  . lamoTRIgine (LAMICTAL) tablet 200 mg  200 mg Oral Daily Rhetta Mura, MD   200 mg at 06/14/15 1850  . LORazepam (ATIVAN) injection 1-2 mg  1-2 mg Intravenous Q4H PRN Rhetta Mura, MD   2 mg at 06/13/15 0123  . metoprolol tartrate (LOPRESSOR) tablet 12.5 mg  12.5 mg Oral BID Rhetta Mura, MD   12.5 mg at 06/14/15 2105  . midazolam (VERSED) injection 2 mg  2 mg Intravenous Once Lunette Stands, MD      . ondansetron [facility]) injection 4 mg  4 mg Intravenous Once PRN Karlyne Greenspan, MD      . pantoprazole (PROTONIX) EC tablet 40 mg  40 mg Oral Daily Rhetta Mura, MD   40 mg at 06/14/15 1849  . sodium chloride 0.9 % injection 3 mL  3 mL Intravenous Q12H Rhetta Mura, MD   3  mL at 06/14/15 2105      Gen: In bed, NAD MS: alert and able to state she is in [place] hospital but believes the year is 2016.  She has no aphasia but due to facial movements it is hard to decipher what she is saying.  Per nurse she is refusing all help and liquids.  CN: PERRLA, EOMI, TML, face symmetric Motor: Moving all extremities antigravity with myoclonic movents greater in face and bilateral UE.  More prominent when aggrivated.  Sensory: intact throughout   Pertinent Labs: VPA level 38 TSH 0.831 EEG pending MRI non-revaling    Impression: 75 year old lady with ongoing generalized myoclonic activity in all 4 extremities over several months. This is generally secondary to encephalopathy. MRI brain did not show any acute pathology . She is on polypharmacy of several neurological and psychiatric medications. Depakote was recently added couple of days ago. Due to interaction with Depakote and lamotrigine, with the risk of supratherapeutic levels of Lamictal with the addition of Depakote, recommend discontinuing Depakote. Also discontinue Wellbutrin to further help with the polypharmacy.  Recommend adding a small dose of clonazepam 0.25 mg in the morning after breakfast, 0.25 mg after lunch and  0.5 mg after dinner to symptomatically help with the myoclonus. We will obtain an EEG to evaluate for any underlying epileptiform disorder .   Patient was re-examined late in the evening after clonazepam was initiated , and the myoclonic limb activity appears to have improved. Patient is sedated from medications. Recommend overnight continuous pulse oximetry.  Neurology will continue to follow up. please call for any further questions

## 2015-06-15 NOTE — Care Management Note (Signed)
Case Management Note  Patient Details  Name: Edwena Feltyatsy Asbill MRN: [id number] Date of Birth: [birth year]  Subjective/Objective:     Patient admitted with deterioration of mental status, hallucinations, tremors. [facility] is from [facility].                Action/Plan: Plan is to return [facility] at discharge. CM will continue to follow for discharge needs.  Expected Discharge Date:                  Expected Discharge Plan:  Skilled Nursing Facility  In-House Referral:     Discharge planning Services     Post Acute Care Choice:    Choice offered to:     DME Arranged:    DME Agency:     HH Arranged:    HH Agency:     Status of Service:  In process, will continue to follow  Medicare Important Message Given:    Date Medicare IM Given:    Medicare IM give by:    Date Additional Medicare IM Given:    Additional Medicare Important Message give by:     If discussed at Long Length of Stay Meetings, dates discussed:    Additional Comments:  Kermit BaloKelli F Gevork Ayyad, RN 06/15/2015, 3:22 PM

## 2015-06-16 ENCOUNTER — Telehealth: Payer: Self-pay | Admitting: *Deleted

## 2015-06-16 ENCOUNTER — Ambulatory Visit: Payer: Medicare Other | Admitting: Neurology

## 2015-06-16 ENCOUNTER — Encounter (HOSPITAL_COMMUNITY): Payer: Self-pay | Admitting: Radiology

## 2015-06-16 ENCOUNTER — Encounter: Payer: Self-pay | Admitting: Neurology

## 2015-06-16 LAB — COMPREHENSIVE METABOLIC PANEL
ALBUMIN: 3 g/dL — AB (ref 3.5–5.0)
ALK PHOS: 52 U/L (ref 38–126)
ALT: 17 U/L (ref 14–54)
ANION GAP: 13 (ref 5–15)
AST: 19 U/L (ref 15–41)
BILIRUBIN TOTAL: 0.9 mg/dL (ref 0.3–1.2)
BUN: 12 mg/dL (ref 6–20)
CALCIUM: 9.3 mg/dL (ref 8.9–10.3)
CO2: 23 mmol/L (ref 22–32)
CREATININE: 1.06 mg/dL — AB (ref 0.44–1.00)
Chloride: 109 mmol/L (ref 101–111)
GFR calc non Af Amer: 51 mL/min — ABNORMAL LOW (ref >60)
GFR, EST AFRICAN AMERICAN: 59 mL/min — AB (ref >60)
GLUCOSE: 84 mg/dL (ref 65–99)
Potassium: 3.7 mmol/L (ref 3.5–5.1)
Sodium: 145 mmol/L (ref 135–145)
TOTAL PROTEIN: 6 g/dL — AB (ref 6.5–8.1)

## 2015-06-16 LAB — URINALYSIS, ROUTINE W REFLEX MICROSCOPIC
Glucose, UA: NEGATIVE mg/dL
Ketones, ur: 40 mg/dL — AB
NITRITE: NEGATIVE
PH: 6 (ref 5.0–8.0)
PROTEIN: 30 mg/dL — AB
SPECIFIC GRAVITY, URINE: 1.016 (ref 1.005–1.030)

## 2015-06-16 LAB — URINE MICROSCOPIC-ADD ON

## 2015-06-16 LAB — GLUCOSE, CAPILLARY: Glucose-Capillary: 73 mg/dL (ref 65–99)

## 2015-06-16 LAB — AMMONIA: Ammonia: 52 umol/L — ABNORMAL HIGH (ref 9–35)

## 2015-06-16 MED ORDER — LACTULOSE 10 GM/15ML PO SOLN
20.0000 g | Freq: Two times a day (BID) | ORAL | Status: DC
  Administered 2015-06-17: 20 g via ORAL
  Administered 2015-06-17: 10 g via ORAL
  Filled 2015-06-16 (×6): qty 30

## 2015-06-16 MED ORDER — CLONAZEPAM 0.5 MG PO TABS
0.2500 mg | ORAL_TABLET | Freq: Two times a day (BID) | ORAL | Status: DC
  Administered 2015-06-16 – 2015-06-17 (×4): 0.25 mg via ORAL
  Filled 2015-06-16 (×4): qty 1

## 2015-06-16 MED ORDER — AMANTADINE HCL 100 MG PO CAPS
100.0000 mg | ORAL_CAPSULE | Freq: Every day | ORAL | Status: DC
  Filled 2015-06-16 (×4): qty 1

## 2015-06-16 MED ORDER — FLUOXETINE HCL 10 MG PO CAPS
10.0000 mg | ORAL_CAPSULE | Freq: Every day | ORAL | Status: DC
  Filled 2015-06-16 (×3): qty 1

## 2015-06-16 NOTE — Telephone Encounter (Signed)
No showed new patient appt 

## 2015-06-16 NOTE — Evaluation (Signed)
Physical Therapy Evaluation Patient Details Name: Alexis Thompson MRN: [id number] DOB: 28-Sep-1940 Today's Date: 06/16/2015   History of Present Illness  75 yo female admitted with deterioration of mental status, hallucinations, tremors/myoclonic movements. Hx of bipolar d/o, DM, HTN, psychosis, suical ideation/attempt. Pt is from SNF    Clinical Impression  Pt admitted with above diagnosis. Pt currently with functional limitations due to the deficits listed below (see PT Problem List). Patient cooperative to the extent she could overcome the tone in extremities. Pt will benefit from skilled PT to increase their independence and safety with mobility to allow discharge to the venue listed below.       Follow Up Recommendations SNF;Supervision/Assistance - 24 hour    Equipment Recommendations  None recommended by PT    Recommendations for Other Services       Precautions / Restrictions Precautions Precautions: Fall Restrictions Weight Bearing Restrictions: No      Mobility  Bed Mobility Overal bed mobility: Needs Assistance;+2 for physical assistance Bed Mobility: Rolling;Sidelying to Sit;Sit to Sidelying Rolling: Max assist;+2 for physical assistance Sidelying to sit: Max assist;+2 for physical assistance Sit to sidelying: Total assist;+2 for physical assistance General bed mobility comments: Assist for trunk and bil LEs. Utilized bedpad. Cues for technique.   Transfers                 General transfer comment: unsafe to attempt due to extensor tone  Ambulation/Gait                Stairs            Wheelchair Mobility    Modified Rankin (Stroke Patients Only)       Balance Overall balance assessment: Needs assistance Sitting-balance support: No upper extremity supported;Feet supported Sitting balance-Leahy Scale: Zero Sitting balance - Comments: EOB x 8 minutes for trunk AAROM, neck PROM, LE PROM Postural control: Posterior lean                                    Pertinent Vitals/Pain Pain Assessment: No/denies pain    Home Living Family/patient expects to be discharged to:: Skilled nursing facility                      Prior Function Level of Independence: Needs assistance         Comments: came from SNF with recent adm for rehab; specifics unknown     Hand Dominance   Dominant Hand: Right    Extremity/Trunk Assessment   Upper Extremity Assessment: Defer to OT evaluation;RUE deficits/detail;LUE deficits/detail           Lower Extremity Assessment: RLE deficits/detail;LLE deficits/detail RLE Deficits / Details: in supine, PROM WFL; seated, pt with incr extensor tone in torso and bil LE. With slow stretch, could gain hip flexion to 100; unable to get >20 knee flexion LLE Deficits / Details: in supine, PROM WFL; seated, pt with incr extensor tone in torso and bil LE. With slow stretch, could gain hip flexion to 100; unable to get >20 knee flexion  Cervical / Trunk Assessment: Kyphotic  Communication   Communication: Expressive difficulties (at times unintelligible; clarifies with repeat)  Cognition Arousal/Alertness:  (answering all questions although eyes closed throughout) Behavior During Therapy: Flat affect Overall Cognitive Status: Difficult to assess  General Comments      Exercises        Assessment/Plan    PT Assessment Patient needs continued PT services  PT Diagnosis Difficulty walking   PT Problem List Decreased strength;Decreased activity tolerance;Decreased balance;Decreased mobility;Decreased knowledge of use of DME;Impaired tone;Obesity  PT Treatment Interventions DME instruction;Gait training;Functional mobility training;Therapeutic activities;Therapeutic exercise;Balance training;Patient/family education;Cognitive remediation   PT Goals (Current goals can be found in the Care Plan section) Acute Rehab PT Goals PT Goal Formulation:  Patient unable to participate in goal setting Time For Goal Achievement: 06/30/15 Potential to Achieve Goals: Fair    Frequency Min 2X/week   Barriers to discharge        Co-evaluation               End of Session   Activity Tolerance: Patient limited by fatigue Patient left: in bed;with nursing/sitter in room;with call bell/phone within reach Nurse Communication: Mobility status (?able safe to use maximove with extensor tone)         Time: [id number] PT Time Calculation (min) (ACUTE ONLY): 20 min   Charges:         PT G Codes:        Alexis Thompson 06/16/2015, 1:29 PM  Pager [phone]

## 2015-06-16 NOTE — Progress Notes (Signed)
Pt has not voided since 0800. Bladder scanned >400. In/Out straight cath preformed. Output of tea colored urine with sediment, strong malodorous. Dark red mucous on tip of catheter with removal. Urine specimen collected and sent to lab.

## 2015-06-16 NOTE — Progress Notes (Signed)
[facility] PROGRESS NOTE  Alexis Thompson:[id number] DOB: May 24, 1941 DOA: 06/12/2015 PCP: Oneal Grout, MD  Assessment/Plan: 1. Bilateral tremors/acute encephalopathy Family members had reported patient having abnormal movements/tremors for the past 6 weeks that has been associate with mental status changes. She had a recent hospitalization at [facility] where CT scan of brain was unremarkable. -At discharge from her last hospitalization she was given trial gabapentin that had resulted in improvement to tremors. -Family reporting that tremors came back several days ago -Unclear underlying infectious processes precipitating abnormal movements or if there is a primary underlying neurologic condition. Medications also a consideration. -She was seen and evaluated by neurology. Dr Cyril Mourning recommended transfer to [facility] to undergo further workup with EEG and MRI. MRI of the brain was negative, chronic microvascular ischemic changes with global atrophy She she was being treated with amantadine 100 mg twice a day, gabapentin 300 mg 3 times a day, Lamictal 200 mg daily and Wellbutrin 300 mg by mouth daily, Depakote 500 mg PO BID  According to Dr. Lavon Paganini, Depakote may result in supratherapeutic levels of Lamictal, therefore Depakote and Wellbutrin discontinued, patient started on Klonopin by neurology Abnormal EEG due to the presence of generalized slowing indicating a moderate to severe cerebral disturbance (encephalopathy). No epileptiform activity noted.  PT OT evaluation pending  To decrease sedation, neurology has decreased the dose of Klonopin and discontinue Neurontin, and also decrease the dose of Prozac Check ammonia level and UA [negative on 06/12/15]   2. History of sepsis.  -She was recently hospitalized for sepsis in setting of urinary tract infection and parotiditis, discharged on Augmentin which she completed. -Initial labs showing a normal white count  of 7500, lactic acid of 1.22, urinalysis negative, with chest x-ray not showing acute consolidation. -No indication for antimicrobial therapy, as there is not an identifiable source of infection to explain tremors/encephalopathy.   3.  Bipolar disorder -For now will continue Lamictal and Wellbutrin.   4.  Acute kidney injury. -Initial labs showing creatinine of 1.3, , decreasing > 1.06. Likely secondary to prerenal azotemia. IV fluids discontinued   Code Status: Full code Family Communication: We have been in touch with Anthony Sar to give updates. Her telephone # [phone]. Eunice Blase is contact person and wishes to be updated daily.   Disposition Plan: Pending further recommendations from neurology   Consultants:  Neurology  HPI : [name] is a 75 year old female with a past medical history of bipolar disorder, progressive cognitive disorder, having recent hospitalization at [facility] from 05/27/2015 through 06/03/2015 where she was treated for sepsis in setting of peritonitis/urinary tract infection. At the time she presented with acute encephalopathy. During that hospitalization she was evaluated by Dr. Amada Jupiter of neurology suspecting delirium. Presented to the emergency department at [facility] with complaints of worsening tremors associated with agitation, visual hallucinations. He was seen and evaluated by neurology at [facility] who recommended transfer to [facility] for further workup.   Subjective-No myoclonus while asleep but when woken up the jaw and UE show myoclonic like activity. Less intense than yesterday.   Objective: Filed Vitals:   06/16/15 0653 06/16/15 0903  BP: 109/92 149/78  Pulse: 48 51  Temp: 98.2 F (36.8 C)   Resp: 17 17   No intake or output data in the 24 hours ending 06/16/15 1246 Filed Weights   06/13/15 0544 06/15/15 1500  Weight: 86.1 kg (189 lb 13.1 oz) 86.1 kg (189 lb 13.1 oz)  Exam:   General:  Somnolent  but arousable, lethargic unable to follow commands  Cardiovascular: Regular rate and rhythm normal S1-S2  Respiratory: Normal respiratory effort, lungs are clear to auscultation bilaterally  Abdomen: Soft nontender nondistended  Musculoskeletal: No edema Neurological:Motor: Moving all extremities antigravity with myoclonic movents greater in face and bilateral UE. More prominent when aggrivated.   Sensory: withdraws from pain throughout  Data Reviewed: Basic Metabolic Panel:  Recent Labs Lab 06/12/15 1153 06/13/15 0408 06/14/15 0230 06/15/15 0357 06/16/15 0644  NA 142 144 147* 145 145  K 4.0 3.8 3.7 3.5 3.7  CL 107 107 111 109 109  CO2 24 24 26 23 23   GLUCOSE 104* 92 79 82 84  BUN 14 12 13 13 12   CREATININE 1.30* 1.29* 1.22* 1.25* 1.06*  CALCIUM 9.6 9.8 9.5 9.3 9.3   Liver Function Tests:  Recent Labs Lab 06/12/15 1153 06/13/15 0408 06/16/15 0644  AST 21 25 19   ALT 18 18 17   ALKPHOS 57 54 52  BILITOT 1.1 1.1 0.9  PROT 6.8 6.1* 6.0*  ALBUMIN 3.9 3.5 3.0*   No results for input(s): LIPASE, AMYLASE in the last 168 hours.  Recent Labs Lab 06/15/15 0900 06/16/15 1126  AMMONIA 40* 52*   CBC:  Recent Labs Lab 06/12/15 1153 06/13/15 0408 06/14/15 0230 06/15/15 0357  WBC 7.5 7.5 5.4 5.1  NEUTROABS 4.6  --   --   --   HGB 11.5* 11.5* 11.6* 11.2*  HCT 36.0 36.1 36.0 35.4*  MCV 96.0 96.0 97.6 96.2  PLT 228 218 192 179   Cardiac Enzymes:  Recent Labs Lab 06/15/15 0900  CKTOTAL 93   BNP (last 3 results) No results for input(s): BNP in the last 8760 hours.  ProBNP (last 3 results) No results for input(s): PROBNP in the last 8760 hours.  CBG:  Recent Labs Lab 06/12/15 2256 06/13/15 0635 06/13/15 1113 06/13/15 1619 06/14/15 1755  GLUCAP 83 92 89 86 73    Recent Results (from the past 240 hour(s))  Culture, blood (routine x 2)     Status: None (Preliminary result)   Collection Time: 06/12/15 11:52 AM  Result Value Ref Range Status    Specimen Description BLOOD BLOOD RIGHT HAND  Final   Special Requests BOTTLES DRAWN AEROBIC AND ANAEROBIC 5CC  Final   Culture   Final    NO GROWTH 3 DAYS Performed at [facility]    Report Status PENDING  Incomplete  Culture, blood (routine x 2)     Status: None (Preliminary result)   Collection Time: 06/12/15 11:52 AM  Result Value Ref Range Status   Specimen Description BLOOD BLOOD LEFT HAND  Final   Special Requests IN PEDIATRIC BOTTLE 1CC  Final   Culture   Final    NO GROWTH 3 DAYS Performed at [facility]    Report Status PENDING  Incomplete  Urine culture     Status: None   Collection Time: 06/12/15 11:57 AM  Result Value Ref Range Status   Specimen Description URINE, CATHETERIZED  Final   Special Requests NONE  Final   Culture   Final    NO GROWTH 1 DAY Performed at [facility]    Report Status 06/13/2015 FINAL  Final  MRSA PCR Screening     Status: Abnormal   Collection Time: 06/13/15  4:08 AM  Result Value Ref Range Status   MRSA by PCR POSITIVE (A) NEGATIVE Final    Comment:  The GeneXpert MRSA Assay (FDA approved for NASAL specimens only), is one component of a comprehensive MRSA colonization surveillance program. It is not intended to diagnose MRSA infection nor to guide or monitor treatment for MRSA infections. RESULT CALLED TO, READ BACK BY AND VERIFIED WITH: J [facility] LLCTELMAN @0554  06/13/15 MKELLY      Studies: Mr Brain Wo Contrast  06/14/2015  CLINICAL DATA:  75 year old lady with progressive cognitive decline as well as diffuse myoclonus. Etiology unclear. Stroke risk factors include diabetes, hyperlipidemia and/hypercholesterolemia, and hypertension. History of suicide attempt by multiple drug overdose. History of bipolar 1 disorder. EXAM: MRI HEAD WITHOUT CONTRAST TECHNIQUE: Multiplanar, multiecho pulse sequences of the brain and surrounding structures were obtained without intravenous contrast. COMPARISON:  CT head most recent  06/12/2015. Also prior CT head scans dating as far back as 10/23/2012. FINDINGS: No restricted diffusion to suggest acute stroke or demyelinating process. No hemorrhage, mass lesion, or extra-axial fluid. Marked ventriculomegaly consistent with central atrophy. Significant cerebral cortical atrophy is present as well. Cerebellar atrophy is present but less severe. No features of brainstem or pontine atrophy. No areas of cortical or significant lacunar infarction. Flow voids are maintained. RIGHT vertebral is slightly larger. Extensive confluent white matter signal abnormality of a nonspecific nature, but likely ischemic demyelination. Similar hypoattenuation of white matter can be observed as far back as 2014 on CT (no prior MR for comparison). Normal pituitary and cerebellar tonsils. Mild transverse ligament hypertrophy. Mild cervical spondylosis with disc space narrowing C4-C5. Small joint effusions at occiput-C1 and C1-C2. On gradient sequence, no signs of parenchymal shearing injury, or siderosis to correlate with the history reported spell small abuse. Layering fluid in the nasopharynx but no sinus air-fluid level. Negative orbits with dysconjugate gaze. Trace BILATERAL mastoid fluid. Scalp soft tissues unremarkable. No signs of parotid inflammation or deep space infection. IMPRESSION: Global atrophy.  No acute intracranial findings. Significant confluent supratentorial white matter disease favored to represent chronic microvascular ischemic change, appears similar to prior CT in 2014, when technique differences are considered. Electronically Signed   By: Elsie StainJohn T Curnes M.D.   On: 06/14/2015 18:56    Scheduled Meds: . amantadine  100 mg Oral Daily  . aspirin EC  81 mg Oral Daily  . clonazePAM  0.25 mg Oral BID  . enoxaparin (LOVENOX) injection  40 mg Subcutaneous Q24H  . [START ON 06/17/2015] FLUoxetine  10 mg Oral Daily  . lamoTRIgine  200 mg Oral Daily  . metoprolol tartrate  12.5 mg Oral BID  .  midazolam  2 mg Intravenous Once  . pantoprazole  40 mg Oral Daily  . sodium chloride  3 mL Intravenous Q12H   Continuous Infusions:    Active Problems:   Tremor   Acathisia   Altered mental state   Myoclonus   Acute encephalopathy    Time spent: 25 min    Ortonville Area Health ServiceBROL,Jenisha Faison  [facility] Pager [phone]. If 7PM-7AM, please contact night-coverage at [email or web address], password [email or web address] 06/16/2015, 12:46 PM  LOS: 4 days

## 2015-06-16 NOTE — Evaluation (Signed)
Occupational Therapy Evaluation Patient Details Name: Alexis Thompson MRN: [id number] DOB: 1941-03-10 Today's Date: 06/16/2015    History of Present Illness 75 yo female admitted with deterioration of mental status, hallucinations, tremors. Hx of bipolar d/o, DM, HTN, psychosis, suical ideation/attempt. Pt is from SNF   Clinical Impression   Pt admitted with above and present with below impairments (see OT Problem List).  Pt difficult to engage in treatment session, pt unwilling or unable to open eyes to stimulus.  +2 for all bed mobility to engage in sitting at EOB with total assist to maintain sitting balance and 2nd person assisting with drink through straw.  Pt returned to sidelying with +2 to position back in bed.  Pt will benefit from OT acutely to address impairments affecting her ability to complete ADLs to prepare her to d/c to below.    Follow Up Recommendations  SNF    Equipment Recommendations  None recommended by OT    Recommendations for Other Services       Precautions / Restrictions Precautions Precautions: Fall Restrictions Weight Bearing Restrictions: No      Mobility Bed Mobility Overal bed mobility: Needs Assistance Bed Mobility: Rolling;Supine to Sit;Sit to Supine Rolling: +2 for safety/equipment   Supine to sit: +2 for safety/equipment;+2 for physical assistance;HOB elevated Sit to supine: +2 for physical assistance;+2 for safety/equipment   General bed mobility comments: Assist for trunk and bil LEs. Utilized bedpad. Cues for technique.             ADL Overall ADL's : Needs assistance/impaired                                       General ADL Comments: Pt required +2 for bed mobility and total assist to maintain sitting at EOB.       Vision Additional Comments: Unable to assess secondary to pt remaining with eyes closed throughout session.  Pt did open eyes one time to therapist prompting, but then unable or unwilling to open  again          Pertinent Vitals/Pain Pain Assessment: No/denies pain     Hand Dominance Right   Extremity/Trunk Assessment Upper Extremity Assessment Upper Extremity Assessment: Generalized weakness (difficult to assess due to arousal and tremors, limited AROM)       Cervical / Trunk Assessment Cervical / Trunk Assessment: Kyphotic   Communication Communication Communication: No difficulties   Cognition Arousal/Alertness: Lethargic;Suspect due to medications Behavior During Therapy: Flat affect Overall Cognitive Status: Difficult to assess                                Home Living Family/patient expects to be discharged to:: Skilled nursing facility                                        Prior Functioning/Environment Level of Independence: Needs assistance        Comments: Pt was initially at SNF for rehab with intermittent participation due to change in tremors and mental status.    OT Diagnosis: Generalized weakness;Altered mental status   OT Problem List: Decreased strength;Decreased range of motion;Decreased activity tolerance;Impaired balance (sitting and/or standing);Impaired vision/perception;Decreased coordination;Impaired UE functional use   OT Treatment/Interventions: Self-care/ADL training;Therapeutic activities  OT Goals(Current goals can be found in the care plan section) Acute Rehab OT Goals OT Goal Formulation: Patient unable to participate in goal setting Time For Goal Achievement: 06/30/15 Potential to Achieve Goals: Fair  OT Frequency: Min 1X/week    End of Session    Activity Tolerance: Patient limited by lethargy;Treatment limited secondary to medical complications (Comment) Patient left: in bed;with nursing/sitter in room   Time: 1610-9604  OT Time Calculation (min): 16 min Charges:  OT General Charges $OT Visit: 1 Procedure OT Evaluation High complexity $Initial OT Evaluation Tier I: 1  Procedure G-CodesRosalio Loud, [id number] 06/16/2015, 10:11 AM

## 2015-06-16 NOTE — Progress Notes (Signed)
Subjective: No myoclonus while asleep but when woken up the jaw and UE show myoclonic like activity.  Less intense than yesterday.   Exam: Filed Vitals:   06/16/15 0653 06/16/15 0903  BP: 109/92 149/78  Pulse: 48 51  Temp: 98.2 F (36.8 C)   Resp: 17     HEENT-  Normocephalic, no lesions, without obvious abnormality.  Normal external eye and conjunctiva.  Normal TM's bilaterally.  Normal auditory canals and external ears. Normal external nose, mucus membranes and septum.  Normal pharynx. Cardiovascular- S1, S2 normal, pulses palpable throughout   Lungs- chest clear, no wheezing, rales, normal symmetric air entry Abdomen- normal findings: bowel sounds normal Extremities- no edema     Gen: In bed, NAD MS: lethargic, confused, follows no commands. Winces to pain and states "I am sitting up".  CN: PERRLA, EOMI, TML, Face symmetric with facial twitching.  Motor: Moving all extremities antigravity with myoclonic movents greater in face and bilateral UE. More prominent when aggrivated.  Sensory: withdraws from pain throughout   Pertinent Labs: EEG shows no epileptiform activity with slow background consistent with encephalopathy MRI shows no acute pathology with significant white matter disease.   Felicie MornDavid Kailon Treese PA-C [facility] [phone]  Impression: 75 year old lady with ongoing generalized myoclonic activity in all 4 extremities over several months. This is generally secondary to encephalopathy. MRI brain did not show any acute pathology .Clonazepam has helped slightly but still showing myoclonus.    Recommendations: 1) Continue Clonazepam for symptomatic relief but at 0.25 mg BID to decrease sedation 2) Will D/C Neurontin and PRN Benzodiazepines. Will decrease dose of Prozac to 10 mg Daily 3) Repeat UA and ammonia    06/16/2015, 9:22 AM

## 2015-06-16 NOTE — NC FL2 (Signed)
[place] MEDICAID FL2 LEVEL OF CARE SCREENING TOOL     IDENTIFICATION  Patient Name: Alexis Thompson Birthdate: 10/08/1940 Sex: female Admission Date (Current Location): 06/12/2015  [facility] and [place] Number:  Producer, television/film/video and Address:  [facility] [place], [address] [place], [place] [address]      Provider Number: [id number]  Attending Physician Name and Address:  Richarda Overlie, MD  Relative Name and Phone Number:       Current Level of Care: Hospital Recommended Level of Care: Skilled Nursing Facility Prior Approval Number:    Date Approved/Denied:   PASRR Number: [id number] F Expires 08/15/2015  Discharge Plan: SNF    Current Diagnoses: Patient Active Problem List   Diagnosis Date Noted  . Acute encephalopathy   . Altered mental state   . Myoclonus   . Acathisia 06/12/2015  . AKI (acute kidney injury) (HCC)   . Confusion   . Cellulitis, neck 05/27/2015  . Sepsis (HCC) 05/27/2015  . Tremor 05/27/2015  . Allergic reaction   . Bipolar I disorder (HCC) 04/27/2015  . Drug induced akathisia 04/27/2015  . Essential hypertension 04/27/2015  . Anxiety 04/27/2015  . Hyperlipidemia 04/27/2015  . Neuropathy (HCC) 04/27/2015  . Insomnia 04/27/2015  . GERD (gastroesophageal reflux disease) 04/27/2015  . Overactive bladder 04/27/2015  . Right hand/wrist pain 04/27/2015    Orientation RESPIRATION BLADDER Height & Weight    Self  Normal Incontinent 5' (152.4 cm) 189 lbs.  BEHAVIORAL SYMPTOMS/MOOD NEUROLOGICAL BOWEL NUTRITION STATUS   (NONE)  (NONE) Incontinent Diet (Heart Healthy)  AMBULATORY STATUS COMMUNICATION OF NEEDS Skin   Extensive Assist Verbally Normal                       Personal Care Assistance Level of Assistance  Bathing, Feeding, Dressing Bathing Assistance: Maximum assistance Feeding assistance: Maximum assistance Dressing Assistance: Maximum assistance     Functional Limitations Info  Sight, Hearing,  Speech Sight Info: Impaired Hearing Info: Adequate Speech Info: Adequate    SPECIAL CARE FACTORS FREQUENCY  PT (By licensed PT), OT (By licensed OT)     PT Frequency: 5x/week OT Frequency: 5x/week            Contractures Contractures Info: Not present    Additional Factors Info  Code Status, Allergies, Psychotropic Code Status Info: Full Allergies Info: Clindamycin/lincomycin, Codeine, Glipizide, Tetracyclines & Related Psychotropic Info: Klonopin, Prozac   Isolation Precautions Info: MRSA     Current Medications (06/16/2015):  This is the current hospital active medication list Current Facility-Administered Medications  Medication Dose Route Frequency Provider Last Rate Last Dose  . amantadine (SYMMETREL) capsule 100 mg  100 mg Oral Daily Ulice Dash, PA-C      . aspirin EC tablet 81 mg  81 mg Oral Daily Rhetta Mura, MD   81 mg at 06/16/15 0904  . clonazePAM (KLONOPIN) tablet 0.25 mg  0.25 mg Oral BID Ulice Dash, PA-C      . enoxaparin (LOVENOX) injection 40 mg  40 mg Subcutaneous Q24H Rhetta Mura, MD   40 mg at 06/16/15 0918  . [START ON 06/17/2015] FLUoxetine (PROZAC) capsule 10 mg  10 mg Oral Daily Ulice Dash, PA-C      . lamoTRIgine (LAMICTAL) tablet 200 mg  200 mg Oral Daily Rhetta Mura, MD   200 mg at 06/16/15 0903  . metoprolol tartrate (LOPRESSOR) tablet 12.5 mg  12.5 mg Oral BID Rhetta Mura, MD   Stopped at  06/16/15 0904  . midazolam (VERSED) injection 2 mg  2 mg Intravenous Once Lunette Standssvaldo A Camilo, MD      . ondansetron (ZOFRAN) injection 4 mg  4 mg Intravenous Once PRN Karlyne GreenspanBenjamin Judd, MD      . pantoprazole (PROTONIX) EC tablet 40 mg  40 mg Oral Daily Rhetta MuraJai-Gurmukh Samtani, MD   40 mg at 06/16/15 0906  . sodium chloride 0.9 % injection 3 mL  3 mL Intravenous Q12H Rhetta MuraJai-Gurmukh Samtani, MD   3 mL at 06/14/15 2105     Discharge Medications: Please see discharge summary for a list of discharge medications.  Relevant Imaging  Results:  Relevant Lab Results:   Additional Information [id number]  Venita LickCampbell, Aprel Egelhoff B, LCSW

## 2015-06-17 LAB — CULTURE, BLOOD (ROUTINE X 2)
CULTURE: NO GROWTH
Culture: NO GROWTH

## 2015-06-17 LAB — COMPREHENSIVE METABOLIC PANEL
ALBUMIN: 3.2 g/dL — AB (ref 3.5–5.0)
ALT: 16 U/L (ref 14–54)
ANION GAP: 14 (ref 5–15)
AST: 19 U/L (ref 15–41)
Alkaline Phosphatase: 56 U/L (ref 38–126)
BUN: 14 mg/dL (ref 6–20)
CHLORIDE: 112 mmol/L — AB (ref 101–111)
CO2: 22 mmol/L (ref 22–32)
Calcium: 9.7 mg/dL (ref 8.9–10.3)
Creatinine, Ser: 1.02 mg/dL — ABNORMAL HIGH (ref 0.44–1.00)
GFR calc non Af Amer: 54 mL/min — ABNORMAL LOW (ref >60)
GLUCOSE: 85 mg/dL (ref 65–99)
Potassium: 3.6 mmol/L (ref 3.5–5.1)
SODIUM: 148 mmol/L — AB (ref 135–145)
Total Bilirubin: 0.9 mg/dL (ref 0.3–1.2)
Total Protein: 6.4 g/dL — ABNORMAL LOW (ref 6.5–8.1)

## 2015-06-17 LAB — CBC
HEMATOCRIT: 38.7 % (ref 36.0–46.0)
HEMOGLOBIN: 12.3 g/dL (ref 12.0–15.0)
MCH: 30.4 pg (ref 26.0–34.0)
MCHC: 31.8 g/dL (ref 30.0–36.0)
MCV: 95.6 fL (ref 78.0–100.0)
Platelets: 179 10*3/uL (ref 150–400)
RBC: 4.05 MIL/uL (ref 3.87–5.11)
RDW: 16.2 % — ABNORMAL HIGH (ref 11.5–15.5)
WBC: 4.7 10*3/uL (ref 4.0–10.5)

## 2015-06-17 LAB — AMMONIA: Ammonia: 39 umol/L — ABNORMAL HIGH (ref 9–35)

## 2015-06-17 LAB — LAMOTRIGINE LEVEL: LAMOTRIGINE LVL: 7.3 ug/mL (ref 2.0–20.0)

## 2015-06-17 MED ORDER — CLONAZEPAM 0.5 MG PO TABS
0.2500 mg | ORAL_TABLET | Freq: Once | ORAL | Status: DC
Start: 2015-06-17 — End: 2015-06-19

## 2015-06-17 NOTE — Progress Notes (Addendum)
Pt has not voided since last I&O cath. Bladder scan revealed >350cc of retained urine.  Md notified.  Order received for straight cath. 450cc of urine drained from bladder.  Will continue to monitor.

## 2015-06-17 NOTE — Progress Notes (Signed)
CSW left message for daughter to complete assessment- await callback. [facility] per SNF rep they do anticipate taking her back at dc- will confirm with family- full assessment to follow.   Reece LevyJanet Keionna Kinnaird, MSW, Theresia MajorsLCSWA  [phone]

## 2015-06-17 NOTE — Progress Notes (Signed)
[facility] PROGRESS NOTE  Algie Cales ZOX:[id number] DOB: 31-Jan-1941 DOA: 06/12/2015 PCP: Oneal Grout, MD  HPI : Mrs. Haman is a 75 year old female with a past medical history of bipolar disorder, progressive cognitive disorder, having recent hospitalization at [facility] from 05/27/2015 through 06/03/2015 where she was treated for sepsis in setting of peritonitis/urinary tract infection. At the time she presented with acute encephalopathy. During that hospitalization she was evaluated by Dr. Amada Jupiter of neurology suspecting delirium. Presented to the emergency department at [facility] with complaints of worsening tremors associated with agitation, visual hallucinations. He was seen and evaluated by neurology at [facility] who recommended transfer to [facility] for further workup.   Assessment/Plan: 1. Bilateral tremors/acute encephalopathy Family members had reported patient having abnormal movements/tremors for the past 6 weeks that has been associate with mental status changes. She had a recent hospitalization at [facility] where CT scan of brain was unremarkable. -At discharge from her last hospitalization she was given trial gabapentin that had resulted in improvement to tremors. -Family reporting that tremors came back several days ago -Unclear underlying infectious processes precipitating abnormal movements or if there is a primary underlying neurologic condition. Medications also a consideration. -She was seen and evaluated by neurology. Dr Cyril Mourning recommended transfer to [facility] to undergo further workup with EEG and MRI. MRI of the brain was negative, chronic microvascular ischemic changes with global atrophy She she was being treated with amantadine 100 mg twice a day, gabapentin 300 mg 3 times a day, Lamictal 200 mg daily and Wellbutrin 300 mg by mouth daily, Depakote 500 mg PO BID  According to Dr. Lavon Paganini, Depakote may result in  supratherapeutic levels of Lamictal, therefore Depakote and Wellbutrin discontinued, patient started on Klonopin by neurology Abnormal EEG due to the presence of generalized slowing indicating a moderate to severe cerebral disturbance (encephalopathy). No epileptiform activity noted.  PT OT evaluation pending  To decrease sedation, neurology has decreased the dose of Klonopin and discontinue Neurontin, and also decrease the dose of Prozac Ammonia level is 39. It is better than before. Continue lactulose. UA is abnormal, however, not truly suggestive of an infection. Will send urine cultures. No clear indication to start antibiotics.   2. History of sepsis.  -She was recently hospitalized for sepsis in setting of urinary tract infection and parotiditis, discharged on Augmentin which she completed. -Initial labs showing a normal white count of 7500, lactic acid of 1.22, urinalysis negative, with chest x-ray not showing acute consolidation. -No indication for antimicrobial therapy, as there is not an identifiable source of infection to explain tremors/encephalopathy.   3.  Bipolar disorder -For now will continue Lamictal and fluoxetine.   4.  Acute kidney injury. -Initial labs showing creatinine of 1.3, , decreasing > 1.06. Likely secondary to prerenal azotemia. IV fluids discontinued   Code Status: Full code Family Communication: We have been in touch with Anthony Sar to give updates. Her telephone # [phone]. Eunice Blase is contact person and wishes to be updated daily.   Disposition Plan: Pending further recommendations from neurology. EEG in 1-2 days. PT and OT evaluation.   Consultants:  Neurology   Subjective: Patient lethargic. Arousable. Few myoclonic activity noted. Not communicative.  Objective: Filed Vitals:   06/17/15 0200 06/17/15 0604  BP: 91/43 139/55  Pulse: 58 57  Temp:  98.1 F (36.7 C)  Resp:  18    Intake/Output Summary (Last 24 hours) at 06/17/15  0911 Last data filed at 06/17/15 [phone]  Gross per 24 hour  Intake      3 ml  Output   1050 ml  Net  -1047 ml   Filed Weights   06/13/15 0544 06/15/15 1500  Weight: 86.1 kg (189 lb 13.1 oz) 86.1 kg (189 lb 13.1 oz)    Exam:   General:  Somnolent but arousable, lethargic unable to follow commands  Cardiovascular: Regular rate and rhythm normal S1-S2  Respiratory: Diminished air entry at the bases. No crackles or wheezing.  Abdomen: Soft nontender nondistended  Musculoskeletal: No edema Neurological:Motor: Moving all extremities antigravity with myoclonic movents greater in face and bilateral UE. More prominent when aggrivated.   Sensory: withdraws from pain throughout  Data Reviewed: Basic Metabolic Panel:  Recent Labs Lab 06/13/15 0408 06/14/15 0230 06/15/15 0357 06/16/15 0644 06/17/15 0740  NA 144 147* 145 145 148*  K 3.8 3.7 3.5 3.7 3.6  CL 107 111 109 109 112*  CO2 24 26 23 23 22   GLUCOSE 92 79 82 84 85  BUN 12 13 13 12 14   CREATININE 1.29* 1.22* 1.25* 1.06* 1.02*  CALCIUM 9.8 9.5 9.3 9.3 9.7   Liver Function Tests:  Recent Labs Lab 06/12/15 1153 06/13/15 0408 06/16/15 0644 06/17/15 0740  AST 21 25 19 19   ALT 18 18 17 16   ALKPHOS 57 54 52 56  BILITOT 1.1 1.1 0.9 0.9  PROT 6.8 6.1* 6.0* 6.4*  ALBUMIN 3.9 3.5 3.0* 3.2*   No results for input(s): LIPASE, AMYLASE in the last 168 hours.  Recent Labs Lab 06/15/15 0900 06/16/15 1126 06/17/15 0730  AMMONIA 40* 52* 39*   CBC:  Recent Labs Lab 06/12/15 1153 06/13/15 0408 06/14/15 0230 06/15/15 0357 06/17/15 0740  WBC 7.5 7.5 5.4 5.1 4.7  NEUTROABS 4.6  --   --   --   --   HGB 11.5* 11.5* 11.6* 11.2* 12.3  HCT 36.0 36.1 36.0 35.4* 38.7  MCV 96.0 96.0 97.6 96.2 95.6  PLT 228 218 192 179 179   Cardiac Enzymes:  Recent Labs Lab 06/15/15 0900  CKTOTAL 93   CBG:  Recent Labs Lab 06/12/15 2256 06/13/15 0635 06/13/15 1113 06/13/15 1619 06/14/15 1755  GLUCAP 83 92 89 86 73     Recent Results (from the past 240 hour(s))  Culture, blood (routine x 2)     Status: None (Preliminary result)   Collection Time: 06/12/15 11:52 AM  Result Value Ref Range Status   Specimen Description BLOOD BLOOD RIGHT HAND  Final   Special Requests BOTTLES DRAWN AEROBIC AND ANAEROBIC 5CC  Final   Culture   Final    NO GROWTH 4 DAYS Performed at [facility]    Report Status PENDING  Incomplete  Culture, blood (routine x 2)     Status: None (Preliminary result)   Collection Time: 06/12/15 11:52 AM  Result Value Ref Range Status   Specimen Description BLOOD BLOOD LEFT HAND  Final   Special Requests IN PEDIATRIC BOTTLE 1CC  Final   Culture   Final    NO GROWTH 4 DAYS Performed at [facility]    Report Status PENDING  Incomplete  Urine culture     Status: None   Collection Time: 06/12/15 11:57 AM  Result Value Ref Range Status   Specimen Description URINE, CATHETERIZED  Final   Special Requests NONE  Final   Culture   Final    NO GROWTH 1 DAY Performed at [facility]    Report Status 06/13/2015 FINAL  Final  MRSA PCR Screening     Status: Abnormal   Collection Time: 06/13/15  4:08 AM  Result Value Ref Range Status   MRSA by PCR POSITIVE (A) NEGATIVE Final    Comment:        The GeneXpert MRSA Assay (FDA approved for NASAL specimens only), is one component of a comprehensive MRSA colonization surveillance program. It is not intended to diagnose MRSA infection nor to guide or monitor treatment for MRSA infections. RESULT CALLED TO, READ BACK BY AND VERIFIED WITH: J STELMAN @0554  06/13/15 MKELLY      Studies: No results found.  Scheduled Meds: . amantadine  100 mg Oral Daily  . aspirin EC  81 mg Oral Daily  . clonazePAM  0.25 mg Oral BID  . enoxaparin (LOVENOX) injection  40 mg Subcutaneous Q24H  . FLUoxetine  10 mg Oral Daily  . lactulose  20 g Oral BID  . lamoTRIgine  200 mg Oral Daily  . metoprolol tartrate  12.5 mg Oral BID  .  midazolam  2 mg Intravenous Once  . pantoprazole  40 mg Oral Daily  . sodium chloride  3 mL Intravenous Q12H   Continuous Infusions:    Active Problems:   Tremor   Acathisia   Altered mental state   Myoclonus   Acute encephalopathy    Time spent: 25 min    [facility] - CalhounKRISHNAN,Cecilie Heidel  [facility] Pager [phone].    If 7PM-7AM, please contact night-coverage at [email or web address], password [email or web address] 06/17/2015, 9:11 AM  LOS: 5 days

## 2015-06-18 LAB — COMPREHENSIVE METABOLIC PANEL
ALBUMIN: 3.5 g/dL (ref 3.5–5.0)
ALT: 17 U/L (ref 14–54)
AST: 19 U/L (ref 15–41)
Alkaline Phosphatase: 57 U/L (ref 38–126)
Anion gap: 16 — ABNORMAL HIGH (ref 5–15)
BILIRUBIN TOTAL: 1 mg/dL (ref 0.3–1.2)
BUN: 15 mg/dL (ref 6–20)
CALCIUM: 9.7 mg/dL (ref 8.9–10.3)
CHLORIDE: 110 mmol/L (ref 101–111)
CO2: 22 mmol/L (ref 22–32)
CREATININE: 1.15 mg/dL — AB (ref 0.44–1.00)
GFR, EST AFRICAN AMERICAN: 54 mL/min — AB (ref >60)
GFR, EST NON AFRICAN AMERICAN: 46 mL/min — AB (ref >60)
Glucose, Bld: 83 mg/dL (ref 65–99)
Potassium: 3.5 mmol/L (ref 3.5–5.1)
Sodium: 148 mmol/L — ABNORMAL HIGH (ref 135–145)
TOTAL PROTEIN: 6.3 g/dL — AB (ref 6.5–8.1)

## 2015-06-18 LAB — CBC
HCT: 36.7 % (ref 36.0–46.0)
Hemoglobin: 11.9 g/dL — ABNORMAL LOW (ref 12.0–15.0)
MCH: 30.9 pg (ref 26.0–34.0)
MCHC: 32.4 g/dL (ref 30.0–36.0)
MCV: 95.3 fL (ref 78.0–100.0)
PLATELETS: 169 10*3/uL (ref 150–400)
RBC: 3.85 MIL/uL — ABNORMAL LOW (ref 3.87–5.11)
RDW: 16.3 % — AB (ref 11.5–15.5)
WBC: 4.7 10*3/uL (ref 4.0–10.5)

## 2015-06-18 LAB — AMMONIA: AMMONIA: 37 umol/L — AB (ref 9–35)

## 2015-06-18 MED ORDER — CEFTRIAXONE SODIUM 1 G IJ SOLR
1.0000 g | INTRAMUSCULAR | Status: DC
  Administered 2015-06-18 – 2015-06-19 (×2): 1 g via INTRAMUSCULAR
  Filled 2015-06-18 (×2): qty 10

## 2015-06-18 MED ORDER — SODIUM CHLORIDE 0.45 % IV SOLN: INTRAVENOUS | Status: DC

## 2015-06-18 MED ORDER — DEXTROSE 5 % IV SOLN
1.0000 g | INTRAVENOUS | Status: DC
  Filled 2015-06-18: qty 10

## 2015-06-18 MED ORDER — CEFTRIAXONE SODIUM 1 G IJ SOLR
1.0000 g | INTRAMUSCULAR | Status: DC
  Filled 2015-06-18: qty 10

## 2015-06-18 NOTE — Progress Notes (Signed)
[facility] PROGRESS NOTE  Alexis Thompson ZOX:[id number] DOB: 04-Jan-1941 DOA: 06/12/2015 PCP: Oneal Grout, MD  HPI : [name] is a 75 year old female with a past medical history of bipolar disorder, progressive cognitive disorder, having recent hospitalization at [facility] from 05/27/2015 through 06/03/2015 where she was treated for sepsis in setting of peritonitis/urinary tract infection. At the time she presented with acute encephalopathy. During that hospitalization she was evaluated by Dr. Amada Jupiter of neurology suspecting delirium. Presented to the emergency department at [facility] with complaints of worsening tremors associated with agitation, visual hallucinations. He was seen and evaluated by neurology at [facility] who recommended transfer to [facility] for further workup.   Assessment/Plan: 1. Bilateral tremors/acute toxic/metabolic encephalopathy 2. 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 She had a recent hospitalization at [facility] where CT scan of brain was unremarkable. -At discharge from her last hospitalization she was given trial gabapentin that had resulted in improvement to tremors. -Family reporting that tremors came back several days ago -Unclear underlying infectious processes precipitating abnormal movements or if there is a primary underlying neurologic condition. Repeat UA 1/3 slightly positive, will check urine culture and start Rocephin -She was seen and evaluated by neurology. Dr Cyril Mourning recommended transfer to [facility] to undergo further workup with EEG and MRI. MRI of the brain was negative, chronic microvascular ischemic changes with global atrophy She  was being treated with amantadine 100 mg twice a day, gabapentin 300 mg 3 times a day, Lamictal 200 mg daily  and Wellbutrin 300 mg by mouth daily, Depakote 500 mg PO BID  According to Dr. Lavon Paganini, Depakote may result in supratherapeutic levels of Lamictal, therefore Depakote and Wellbutrin discontinued, patient started on Klonopin by neurology Abnormal EEG due to the presence of generalized slowing indicating a moderate to severe cerebral disturbance (encephalopathy). No epileptiform activity noted.  PT OT evaluation recommended SNF  neurology has decreased the dose of Klonopin and discontinue Neurontin, and also decrease the dose of Prozac Ammonia level is 37. It is better than before. Continue lactulose. UA is abnormal, however, not truly suggestive of an infection.  Start Rocephin for possible UTI and DC if urine culture negative Also suspect a component of vascular dementia given global atrophy and chronic microvascular ischemic changes Palliative care consult for GOC     2. History of sepsis.  -She was recently hospitalized for sepsis in setting of urinary tract infection and parotiditis, discharged on Augmentin which she completed. -Initial labs showing a normal white count of 7500, lactic acid of 1.22, urinalysis negative, with chest x-ray not showing acute consolidation.  Possible UTI-start Rocephin   3.  Bipolar disorder -For now will continue Lamictal and fluoxetine.   4.  Acute kidney injury. -Initial labs showing creatinine of 1.3, , decreasing > 1.06. Likely secondary to prerenal azotemia. IV fluids discontinued  5. Poor oral intake Obtain nutrition consult, started on half normal saline given hypernatremia today, encourage PO fluid intake   Code Status: Full code Family Communication: We have been in touch with Anthony Sar to give updates. Her telephone # [phone]. Eunice Blase is contact person and wishes to be updated daily.   Disposition Plan: Pending further recommendations from neurology.  Likely be discharged to her memory care unit, younger daughter and husband ok  with talking to palliative care    Consultants: Also  suspect a component of vascular dementia  Neurology   Subjective: More awake and alert ,than 2 days ,  Not eating or drinking   Objective: Filed Vitals:   06/18/15 0600 06/18/15 0934  BP: 163/81 134/95  Pulse: 56 76  Temp: 98.4 F (36.9 C) 99 F (37.2 C)  Resp: 18 18    Intake/Output Summary (Last 24 hours) at 06/18/15 0959 Last data filed at 06/17/15 1200  Gross per 24 hour  Intake    200 ml  Output      0 ml  Net    200 ml   Filed Weights   06/13/15 0544 06/15/15 1500  Weight: 86.1 kg (189 lb 13.1 oz) 86.1 kg (189 lb 13.1 oz)    Exam:   General:  Somnolent but arousable, lethargic unable to follow commands  Cardiovascular: Regular rate and rhythm normal S1-S2  Respiratory: Diminished air entry at the bases. No crackles or wheezing.  Abdomen: Soft nontender nondistended  Musculoskeletal: No edema Neurological:Motor: Moving all extremities antigravity with myoclonic movents greater in face and bilateral UE. More prominent when aggrivated.   Sensory: withdraws from pain throughout  Data Reviewed: Basic Metabolic Panel:  Recent Labs Lab 06/14/15 0230 06/15/15 0357 06/16/15 0644 06/17/15 0740 06/18/15 0400  NA 147* 145 145 148* 148*  K 3.7 3.5 3.7 3.6 3.5  CL 111 109 109 112* 110  CO2 26 23 23 22 22   GLUCOSE 79 82 84 85 83  BUN 13 13 12 14 15   CREATININE 1.22* 1.25* 1.06* 1.02* 1.15*  CALCIUM 9.5 9.3 9.3 9.7 9.7   Liver Function Tests:  Recent Labs Lab 06/12/15 1153 06/13/15 0408 06/16/15 0644 06/17/15 0740 06/18/15 0400  AST 21 25 19 19 19   ALT 18 18 17 16 17   ALKPHOS 57 54 52 56 57  BILITOT 1.1 1.1 0.9 0.9 1.0  PROT 6.8 6.1* 6.0* 6.4* 6.3*  ALBUMIN 3.9 3.5 3.0* 3.2* 3.5   No results for input(s): LIPASE, AMYLASE in the last 168 hours.  Recent Labs Lab 06/15/15 0900 06/16/15 1126 06/17/15 0730 06/18/15 0400  AMMONIA 40* 52* 39* 37*   CBC:  Recent Labs Lab  06/12/15 1153 06/13/15 0408 06/14/15 0230 06/15/15 0357 06/17/15 0740 06/18/15 0400  WBC 7.5 7.5 5.4 5.1 4.7 4.7  NEUTROABS 4.6  --   --   --   --   --   HGB 11.5* 11.5* 11.6* 11.2* 12.3 11.9*  HCT 36.0 36.1 36.0 35.4* 38.7 36.7  MCV 96.0 96.0 97.6 96.2 95.6 95.3  PLT 228 218 192 179 179 169   Cardiac Enzymes:  Recent Labs Lab 06/15/15 0900  CKTOTAL 93   CBG:  Recent Labs Lab 06/12/15 2256 06/13/15 0635 06/13/15 1113 06/13/15 1619 06/14/15 1755  GLUCAP 83 92 89 86 73    Recent Results (from the past 240 hour(s))  Culture, blood (routine x 2)     Status: None   Collection Time: 06/12/15 11:52 AM  Result Value Ref Range Status   Specimen Description BLOOD BLOOD RIGHT HAND  Final   Special Requests BOTTLES DRAWN AEROBIC AND ANAEROBIC 5CC  Final   Culture   Final    NO GROWTH 5 DAYS [facility]    Report Status 06/17/2015 FINAL  Final  Culture, blood (routine x 2)     Status: None   Collection Time: 06/12/15 11:52 AM  Result Value Ref Range Status   Specimen Description BLOOD BLOOD LEFT HAND  Final  Special Requests IN PEDIATRIC BOTTLE 1CC  Final   Culture   Final    NO GROWTH 5 DAYS Performed at [facility]    Report Status 06/17/2015 FINAL  Final  Urine culture     Status: None   Collection Time: 06/12/15 11:57 AM  Result Value Ref Range Status   Specimen Description URINE, CATHETERIZED  Final   Special Requests NONE  Final   Culture   Final    NO GROWTH 1 DAY Performed at [facility]    Report Status 06/13/2015 FINAL  Final  MRSA PCR Screening     Status: Abnormal   Collection Time: 06/13/15  4:08 AM  Result Value Ref Range Status   MRSA by PCR POSITIVE (A) NEGATIVE Final    Comment:        The GeneXpert MRSA Assay (FDA approved for NASAL specimens only), is one component of a comprehensive MRSA colonization surveillance program. It is not intended to diagnose MRSA infection nor to guide or monitor  treatment for MRSA infections. RESULT CALLED TO, READ BACK BY AND VERIFIED WITH: J [facility] @0554  06/13/15 MKELLY      Studies: No results found.  Scheduled Meds: . amantadine  100 mg Oral Daily  . aspirin EC  81 mg Oral Daily  . clonazePAM  0.25 mg Oral BID  . clonazePAM  0.25 mg Oral Once  . enoxaparin (LOVENOX) injection  40 mg Subcutaneous Q24H  . FLUoxetine  10 mg Oral Daily  . lactulose  20 g Oral BID  . lamoTRIgine  200 mg Oral Daily  . metoprolol tartrate  12.5 mg Oral BID  . midazolam  2 mg Intravenous Once  . pantoprazole  40 mg Oral Daily  . sodium chloride  3 mL Intravenous Q12H   Continuous Infusions: . sodium chloride      Active Problems:   Tremor   Acathisia   Altered mental state   Myoclonus   Acute encephalopathy    Time spent: 25 min    [facility]  [facility] Pager [phone]   If 7PM-7AM, please contact night-coverage at [email or web address], password [facility] 06/18/2015, 9:59 AM  LOS: 6 days

## 2015-06-18 NOTE — Progress Notes (Signed)
Occupational Therapy Treatment Patient Details Name: Alexis Thompson MRN: [id number] DOB: [birth year] Today's Date: 06/18/2015    History of present illness 75 yo female admitted with deterioration of mental status, hallucinations, tremors/myoclonic movements. Hx of bipolar d/o, DM, HTN, psychosis, suical ideation/attempt. Pt is from SNF   OT comments  Pt more alert this session, however difficult to assess progress towards goals.  Pt semi-reclined in bed with feet hanging off bed, pt restless and tearful upon arrival.  Attempted to console pt and reposition.  Pt appeared to want to sit up, therefore sat EOB with pt demonstrating increased initiation and sequencing with bed mobility.  Upon sitting EOB, pt required max assist to maintain sitting balance secondary to posterior lean, tremors, and continuing to cry.  Pt repositioned in sidelying in bed with decline in crying, still remaining tearful.  Unable to attempt OOB activity or maintain sitting balance secondary to tearfulness and tremors.  RN notified of tearfulness and tremors.  Follow Up Recommendations  SNF    Equipment Recommendations  None recommended by OT    Recommendations for Other Services      Precautions / Restrictions Precautions Precautions: Fall       Mobility Bed Mobility Overal bed mobility: Needs Assistance Bed Mobility: Rolling;Sidelying to Sit;Sit to Sidelying Rolling: Mod assist Sidelying to sit: Max assist     Sit to sidelying: Max assist General bed mobility comments: Pt with limited participation this session secondary to tearfulness and tremors throughout session.  Pt with improved initiation with mobility, wanting to sit EOB however upon sitting EOB pt more tearful/fearful wanting to return to bed.             Frequency Min 1X/week     Progress Toward Goals  OT Goals(current goals can now be found in the care plan section)  Progress towards OT goals: Progressing toward goals     Plan  Discharge plan remains appropriate       End of Session     Activity Tolerance Patient limited by lethargy;Treatment limited secondary to medical complications (Comment)   Patient Left in bed;with bed alarm set   Nurse Communication Other (comment) (pt tearfulness)        Time: [id number] OT Time Calculation (min): 11 min  Charges: OT General Charges $OT Visit: 1 Procedure OT Treatments $Therapeutic Activity: 8-22 mins  Brinna Divelbiss 06/18/2015, 1:36 PM

## 2015-06-18 NOTE — Progress Notes (Signed)
   06/18/15 1413  Clinical Encounter Type  Visited With Family;Health care provider  Visit Type Initial  Referral From [facility] met briefly with patient's husband and daughter. Chaplain offered support and prayer. Chaplain support available as needed.   Jeri Lager, Chaplain 06/18/2015 2:14 PM

## 2015-06-19 ENCOUNTER — Inpatient Hospital Stay (HOSPITAL_COMMUNITY)
Admit: 2015-06-19 | Discharge: 2015-06-19 | Disposition: A | Discharge status: Home / Self Care | Payer: Medicare Other | Attending: Neurology | Admitting: Neurology

## 2015-06-19 DIAGNOSIS — R4182 Altered mental status, unspecified: Secondary | ICD-10-CM

## 2015-06-19 DIAGNOSIS — Z515 Encounter for palliative care: Secondary | ICD-10-CM

## 2015-06-19 DIAGNOSIS — R410 Disorientation, unspecified: Secondary | ICD-10-CM

## 2015-06-19 LAB — CBC
HEMATOCRIT: 39.3 % (ref 36.0–46.0)
Hemoglobin: 12.4 g/dL (ref 12.0–15.0)
MCH: 30.3 pg (ref 26.0–34.0)
MCHC: 31.6 g/dL (ref 30.0–36.0)
MCV: 96.1 fL (ref 78.0–100.0)
Platelets: 159 10*3/uL (ref 150–400)
RBC: 4.09 MIL/uL (ref 3.87–5.11)
RDW: 16.4 % — AB (ref 11.5–15.5)
WBC: 4.9 10*3/uL (ref 4.0–10.5)

## 2015-06-19 LAB — COMPREHENSIVE METABOLIC PANEL
ALK PHOS: 58 U/L (ref 38–126)
ALT: 18 U/L (ref 14–54)
AST: 20 U/L (ref 15–41)
Albumin: 3.6 g/dL (ref 3.5–5.0)
Anion gap: 17 — ABNORMAL HIGH (ref 5–15)
BILIRUBIN TOTAL: 0.9 mg/dL (ref 0.3–1.2)
BUN: 16 mg/dL (ref 6–20)
CO2: 21 mmol/L — ABNORMAL LOW (ref 22–32)
Calcium: 10 mg/dL (ref 8.9–10.3)
Chloride: 113 mmol/L — ABNORMAL HIGH (ref 101–111)
Creatinine, Ser: 1.17 mg/dL — ABNORMAL HIGH (ref 0.44–1.00)
GFR calc Af Amer: 53 mL/min — ABNORMAL LOW (ref >60)
GFR, EST NON AFRICAN AMERICAN: 45 mL/min — AB (ref >60)
Glucose, Bld: 119 mg/dL — ABNORMAL HIGH (ref 65–99)
POTASSIUM: 3.4 mmol/L — AB (ref 3.5–5.1)
Sodium: 151 mmol/L — ABNORMAL HIGH (ref 135–145)
TOTAL PROTEIN: 6.8 g/dL (ref 6.5–8.1)

## 2015-06-19 LAB — AMMONIA: AMMONIA: 39 umol/L — AB (ref 9–35)

## 2015-06-19 MED ORDER — DIAZEPAM 5 MG/ML IJ SOLN
2.5000 mg | INTRAMUSCULAR | Status: DC | PRN
  Administered 2015-06-21 (×2): 5 mg via INTRAVENOUS
  Administered 2015-06-21: 2.5 mg via INTRAVENOUS
  Filled 2015-06-19 (×3): qty 2

## 2015-06-19 MED ORDER — HALOPERIDOL DECANOATE 100 MG/ML IM SOLN
25.0000 mg | Freq: Once | INTRAMUSCULAR | Status: DC
  Filled 2015-06-19: qty 0.25

## 2015-06-19 MED ORDER — SODIUM CHLORIDE 0.45 % IV SOLN
INTRAVENOUS | Status: DC
  Administered 2015-06-19: 14:00:00 via INTRAVENOUS
  Filled 2015-06-19 (×4): qty 1000

## 2015-06-19 MED ORDER — GLYCOPYRROLATE 0.2 MG/ML IJ SOLN
0.2000 mg | INTRAMUSCULAR | Status: DC | PRN
  Administered 2015-06-20 – 2015-06-21 (×2): 0.2 mg via INTRAVENOUS
  Filled 2015-06-19 (×3): qty 1

## 2015-06-19 MED ORDER — HALOPERIDOL LACTATE 5 MG/ML IJ SOLN
2.0000 mg | Freq: Once | INTRAMUSCULAR | Status: AC
  Administered 2015-06-19: 2 mg via INTRAMUSCULAR
  Filled 2015-06-19: qty 1

## 2015-06-19 MED ORDER — HYDROMORPHONE HCL 1 MG/ML IJ SOLN
0.5000 mg | INTRAMUSCULAR | Status: DC | PRN
  Administered 2015-06-19 – 2015-06-20 (×2): 0.5 mg via INTRAVENOUS
  Filled 2015-06-19 (×2): qty 1

## 2015-06-19 MED ORDER — HALOPERIDOL LACTATE 5 MG/ML IJ SOLN
1.0000 mg | Freq: Four times a day (QID) | INTRAMUSCULAR | Status: DC | PRN
  Administered 2015-06-19: 2 mg via INTRAVENOUS
  Filled 2015-06-19: qty 1

## 2015-06-19 MED FILL — Lactated Ringer's Solution: INTRAVENOUS | Qty: 1000 | Status: AC

## 2015-06-19 MED FILL — Propofol IV Emul 200 MG/20ML (10 MG/ML): INTRAVENOUS | Qty: 10 | Status: AC

## 2015-06-19 MED FILL — Lidocaine HCl IV Inj 20 MG/ML: INTRAVENOUS | Qty: 5 | Status: AC

## 2015-06-19 MED FILL — Succinylcholine Chloride Inj 20 MG/ML: INTRAMUSCULAR | Qty: 5 | Status: AC

## 2015-06-19 NOTE — Procedures (Signed)
ELECTROENCEPHALOGRAM REPORT  Date of Study: 06/19/2015  Patient's Name: Alexis Thompson MRN: [id number] Date of Birth: [birth year]  Referring Provider: Felicie Mornavid Smith, PA-C  Clinical History: This is a 75 year old woman with encephalopathy.   Medications: lamoTRIgine (LAMICTAL) tablet 200 mg amantadine (SYMMETREL) capsule 100 mg aspirin EC tablet 81 mg cefTRIAXone (ROCEPHIN) injection 1 g enoxaparin (LOVENOX) injection 40 mg FLUoxetine (PROZAC) capsule 10 mg lactulose (CHRONULAC) 10 GM/15ML solution 20 g metoprolol tartrate (LOPRESSOR) tablet 12.5 mg pantoprazole (PROTONIX) EC tablet 40 mg  Technical Summary: A multichannel digital EEG recording measured by the international 10-20 system with electrodes applied with paste and impedances below 5000 ohms performed as portable with EKG monitoring in an awake and restless patient.  Hyperventilation and photic stimulation were not performed.  The digital EEG was referentially recorded, reformatted, and digitally filtered in a variety of bipolar and referential montages for optimal display.   Description: The patient is awake and restless during the recording. She is noted to have a constant tremor. There is no clear posterior dominant rhythm. The background consists of a large amount of diffuse 4-5 Hz theta and 2-3 Hz delta slowing. Normal sleep architecture is not seen. She is noted to be restless and have a constant tremor, with muscle and movement artifact obscuring EEG mostly over the temporal chains. In between artifact, there were no epileptiform discharges or electrographic seizures seen.    EKG lead obscured by movement artifact.  Impression: This awake EEG is abnormal due to moderate diffuse slowing of the waking background.  Clinical Correlation of the above findings indicates diffuse cerebral dysfunction that is non-specific in etiology and can be seen with hypoxic/ischemic injury, toxic/metabolic encephalopathies, neurodegenerative  disorders, or medication effect.  The record is slightly limited due to movement and muscle artifact, however in between artifact there were no epileptiform discharges seen.     Patrcia DollyKaren Aquino, M.D.

## 2015-06-19 NOTE — Progress Notes (Signed)
Patient transferred to 6N
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
## 2015-06-19 NOTE — Care Management Note (Signed)
Case Management Note  Patient Details  Name: Alexis Thompson MRN: [id number] Date of Birth: 01-19-1941  Subjective/Objective:                    Action/Plan: PT/OT recommending SNF. Awaiting palliative consult. CM continuing to follow for discharge needs.   Expected Discharge Date:                  Expected Discharge Plan:  Skilled Nursing Facility  In-House Referral:     Discharge planning Services     Post Acute Care Choice:    Choice offered to:     DME Arranged:    DME Agency:     HH Arranged:    HH Agency:     Status of Service:  In process, will continue to follow  Medicare Important Message Given:    Date Medicare IM Given:    Medicare IM give by:    Date Additional Medicare IM Given:    Additional Medicare Important Message give by:     If discussed at Long Length of Stay Meetings, dates discussed:    Additional Comments:  Alexis BaloKelli F Naiomi Musto, RN 06/19/2015, 3:50 PM

## 2015-06-19 NOTE — Progress Notes (Signed)
[facility] PROGRESS NOTE  Edwena Feltyatsy Ard ZOX:[id number] DOB: 1940-09-20 DOA: 06/12/2015 PCP: Oneal GroutPANDEY, MAHIMA, MD  HPI : Alexis Thompson is a 75 year old female with a past medical history of bipolar disorder, progressive cognitive disorder, having recent [facility] from 05/27/2015 through 06/03/2015 where she was treated for sepsis in setting of peritonitis/urinary tract infection. At the time she presented with acute encephalopathy. During that hospitalization she was evaluated by Dr. Amada JupiterKirkpatrick of neurology suspecting delirium. [facility]ed [facility] with complaints of worsening tremors associated with agitation, visual hallucinations. He was seen and evaluated by neurology at [facility] who recommended transfer to [facility] for further workup.   Assessment/Plan: 1. 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 She had a recent [facility] where CT scan of brain was unremarkable. -At discharge from her last hospitalization she was given trial of gabapentin that had resulted in improvement to tremors. -Family reporting that tremors came back several days ago -Unclear underlying infectious processes precipitating abnormal movements or if there is a primary underlying neurologic condition. Repeat UA 1/3 slightly positive, will check urine culture and start Rocephin -She was seen and evaluated by neurology. Dr Cyril Mourningamillo recommended [facility] to undergo further workup with EEG and MRI. MRI of the brain was negative, chronic microvascular ischemic changes with global atrophy She  was initially started on amantadine 100 mg twice a day, gabapentin 300 mg 3 times a day, Lamictal 200 mg daily  and Wellbutrin 300 mg by mouth daily, Depakote 500 mg PO BID  Subsequently followed by Dr. Lavon PaganiniNandigam, according to him Depakote may result in supratherapeutic levels of Lamictal, therefore Depakote and Wellbutrin discontinued, patient started on Klonopin by neurology . On 1/5 family requested to discontinue Klonopin given prior abnormal reaction to benzodiazepines in the past Abnormal EEG due to the presence of generalized slowing indicating a moderate to severe cerebral disturbance (encephalopathy). No epileptiform activity noted. Neurology recommended to repeat EEG 1/6, neurology will reevaluate the patient today and provide prognostic information to the family Family agreeable to palliative care meeting and would like to know the recommendations   PT OT evaluation recommended SNF  neurology has discontinued Neurontin, and also decrease the dose of Prozac Ammonia level initially at 52, now improving on lactulose.  Continue lactulose.  UA is abnormal, however, not truly suggestive of an infection.  Started Rocephin for possible UTI and DC if urine culture negative on 1/7 Also suspect a component of vascular dementia given global atrophy and chronic microvascular ischemic changes Palliative care consult for GOC  Hypernatremia  Worsening, further complicating the picture,  Causing metabolic encephalopathy   2. History of sepsis.  -She was recently hospitalized for sepsis in setting of urinary tract infection and parotiditis, discharged on Augmentin which she completed. -Initial labs showing a normal white count of 7500, lactic acid of 1.22, urinalysis negative, with chest x-ray not showing acute consolidation.  Repeat UA  1/3 shows  Possible UTI-started Rocephin 1/5   3.  Bipolar disorder -For now will continue Lamictal and fluoxetine.   4.  Acute kidney injury. -Initial labs showing creatinine of 1.3, , decreasing > 1.06. Likely secondary to prerenal azotemia. Resume IV fluids  5. Poor  oral intake/hypernatremia/hypokalemia  , started on half normal saline given hypernatremia today, encourage PO  fluid intake   Code Status: Full code Family Communication: We have been in touch with Anthony Sar to give updates. Her telephone # [phone]. Eunice Blase is contact person and wishes to be updated daily.   Disposition Plan: Pending further recommendations from neurology.  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 No crackles or wheezing.  Abdomen: Soft nontender nondistended  Musculoskeletal: No edema Neurological:Motor: Moving all extremities antigravity with myoclonic movents greater in face and bilateral UE. More prominent when aggrivated.   Sensory: withdraws from pain throughout  Data Reviewed: Basic Metabolic Panel:  Recent Labs Lab 06/15/15 0357 06/16/15 0644 06/17/15 0740 06/18/15 0400 06/19/15 0535  NA 145 145 148* 148* 151*  K 3.5 3.7 3.6 3.5 3.4*  CL 109 109 112* 110 113*  CO2 23 23 22 22  21*  GLUCOSE 82 84 85 83 119*  BUN 13 12 14 15 16   CREATININE 1.25* 1.06* 1.02* 1.15* 1.17*  CALCIUM 9.3 9.3 9.7 9.7 10.0   Liver Function Tests:  Recent Labs Lab 06/13/15 0408 06/16/15 0644 06/17/15 0740 06/18/15 0400 06/19/15 0535   AST 25 19 19 19 20   ALT 18 17 16 17 18   ALKPHOS 54 52 56 57 58  BILITOT 1.1 0.9 0.9 1.0 0.9  PROT 6.1* 6.0* 6.4* 6.3* 6.8  ALBUMIN 3.5 3.0* 3.2* 3.5 3.6   No results for input(s): LIPASE, AMYLASE in the last 168 hours.  Recent Labs Lab 06/15/15 0900 06/16/15 1126 06/17/15 0730 06/18/15 0400 06/19/15 0535  AMMONIA 40* 52* 39* 37* 39*   CBC:  Recent Labs Lab 06/12/15 1153  06/14/15 0230 06/15/15 0357 06/17/15 0740 06/18/15 0400 06/19/15 0535  WBC 7.5  < > 5.4 5.1 4.7 4.7 4.9  NEUTROABS 4.6  --   --   --   --   --   --   HGB 11.5*  < > 11.6* 11.2* 12.3 11.9* 12.4  HCT 36.0  < > 36.0 35.4* 38.7 36.7 39.3  MCV 96.0  < > 97.6 96.2 95.6 95.3 96.1  PLT 228  < > 192 179 179 169 159  < > = values in this interval not displayed. Cardiac Enzymes:  Recent Labs Lab 06/15/15 0900  CKTOTAL 93   CBG:  Recent Labs Lab 06/12/15 2256 06/13/15 0635 06/13/15 1113 06/13/15 1619 06/14/15 1755  GLUCAP 83 92 89 86 73    Recent Results (from the past 240 hour(s))  Culture, blood (routine x 2)     Status: None   Collection Time: 06/12/15 11:52 AM  Result Value Ref Range Status   Specimen Description BLOOD BLOOD RIGHT HAND  Final   Special Requests BOTTLES DRAWN AEROBIC AND ANAEROBIC 5CC  Final   Culture   Final    NO GROWTH 5 DAYS Performed at [facility]    Report Status 06/17/2015 FINAL  Final  Culture, blood (routine x 2)     Status: None   Collection Time: 06/12/15 11:52 AM  Result Value Ref Range Status   Specimen Description BLOOD BLOOD LEFT HAND  Final   Special Requests IN PEDIATRIC BOTTLE 1CC  Final   Culture   Final    NO GROWTH 5 DAYS Performed at [facility]    Report Status 06/17/2015 FINAL  Final  Urine culture     Status: None   Collection Time: 06/12/15 11:57 AM  Result Value Ref Range Status   Specimen Description URINE, CATHETERIZED  Final   Special Requests NONE  Final   Culture   Final    NO GROWTH 1 DAY Performed at [facility]    Report Status 06/13/2015 FINAL  Final  MRSA PCR Screening     Status: Abnormal   Collection Time: 06/13/15  4:08 AM  Result Value Ref Range Status   MRSA by PCR POSITIVE (A) NEGATIVE Final    Comment:        The GeneXpert MRSA Assay (FDA approved for NASAL specimens only), is one component of a comprehensive MRSA colonization surveillance program. It is not intended to diagnose MRSA infection nor to guide or monitor treatment for MRSA infections. RESULT CALLED TO, READ BACK BY AND VERIFIED WITH: [facility] @0554  06/13/15 MKELLY      Studies: No results found.  Scheduled Meds: . amantadine  100 mg Oral Daily  . aspirin EC  81 mg Oral Daily  . cefTRIAXone (ROCEPHIN) IM  1 g Intramuscular Q24H  . enoxaparin (LOVENOX) injection  40 mg Subcutaneous Q24H  . FLUoxetine  10 mg Oral Daily  . lactulose  20 g Oral BID  . lamoTRIgine  200 mg Oral Daily  . metoprolol tartrate  12.5 mg Oral BID  . midazolam  2 mg Intravenous Once  . pantoprazole  40 mg Oral Daily  . sodium chloride  3 mL Intravenous Q12H   Continuous Infusions: . sodium chloride 0.45 % with kcl      Active Problems:   Tremor   Acathisia   Altered mental state   Myoclonus   Acute encephalopathy    Time spent: 25 min    [facility]  [facility] Pager [phone]   If 7PM-7AM, please contact night-coverage at [email or web address], password [facility] 06/19/2015, 9:50 AM  LOS: 7 days

## 2015-06-19 NOTE — Consult Note (Signed)
Consultation Note Date: 06/19/2015   Patient Name: Alexis Thompson  DOB: 21-Dec-1940  MRN: [id number]  Age / Sex: 75 y.o., female  PCP: Blanchie Serve, MD Referring Physician: Reyne Dumas, MD  Reason for Consultation: Establishing goals of care   Clinical Assessment/Narrative: 75 yo female with known bipolar disorder, DM, progressive cognitive disorder of unclear etiology, HTN, depression with a history of psychosis, and suicidal ideations.  She was hospitalized from 12/14-12/21 with parotiditis, UTI, delirium with hallucinations and akathisia.  During that hospitalization her psychotropic medications were adjusted, haldol and tranxene were held.  She was discharged on gabapentin as that seemed to help her involuntary movements.    She returned to the hospital on 12/30 with worsening uncontrolled movements.  Per the admission H&P she was speaking and coherent at that time.  Per her family that patient has declined since admission.  She is now not eating, unable to communicate meaningfully, and incoherently thrashing about at times.  Neurology has evaluated the patient, MRI and EEG have not revealed the etiology of her symptoms.   Melody her niece at bedside states she has seen similar symptoms in the past when the patient has been unable to take her psychiatric medications.  Psychiatry and Palliative Medicine have been consulted due to  prolonged encephalopathy.   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 Per MRI, pt has global atrophy. 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 Per her children, their mother told them no resuscitation, or artifical feeding. They feel at this point she is nearing EOL and  wish to pursue comfort care.   Contacts/Participants in Discussion: Primary Decision Maker: Eugenie Filler Relationship to Patient:  Daughter  HCPOA: yes Pt has another living daughter and son, as well as husband . Mrs. Hardin Negus is HCPOA but they are all in agreement  With comfort care plan   SUMMARY OF RECOMMENDATIONS Comfort care No artifical feeding  No further abx After this liter of fluids, KVO fluids Comfort medicines to manage any symptoms Introduced concept of in-pt hospice going forward. Will monitor over the weeknd    Code Status/Advance Care Planning: DNR     Code Status Orders        Start     Ordered   06/13/15 0052  Full code   Continuous     06/13/15 0051      Other Directives:None  Symptom Management:   Pain: Will leave order for prn dilaudid 0.5 q4 prn. Pt has been very agitated, question if unmanged pain an aspect of agitation that we have been seeing  Dyspnea: [place] with 02 via [place]. Opioids as above for severe dyspnea  Agitation: Valium 2.5-5 mg q4 prn as well as haldol 1-2 q6 prn. Monitor for need for scheduled dosing  Secretions: Robinul 0.2 mg q4 prn  Palliative Prophylaxis:   Aspiration, Bowel Regimen, Delirium Protocol, Frequent Pain Assessment and Turn Reposition  Additional Recommendations (Limitations, Scope, Preferences):  Full Comfort Care  Transfer to 6 N  Psycho-social/Spiritual:  Support System: Strong Desire for further Chaplaincy support:no Additional Recommendations: Grief/Bereavement Support  Prognosis: < 4 weeks in the setting of acute encephalopathy, vascular dementia  Discharge Planning: Will transfer to 6 N and monitor over the weekend . Pt would qualify for in-pt hospice  if family elects, but could anticipate a hospital death   Chief Complaint/ Primary Diagnoses: Present on Admission:  . Tremor . Acathisia . Bipolar I disorder ([place])  I have reviewed the medical record, interviewed the patient and family,  and examined the patient. The following aspects are pertinent.  Past Medical History  Diagnosis Date  . Bipolar 1 disorder ([place])   . Diabetes mellitus without complication ([place])   . Hypertension   . High cholesterol   . GERD (gastroesophageal reflux disease)   . Depression   . Psychosis   . Suicide attempt by multiple drug overdose [facility])    Social History   Social History  . Marital Status: Married    Spouse Name: N/A  . Number of Children: N/A  . Years of Education: N/A   Social History Main Topics  . Smoking status: Never Smoker   . Smokeless tobacco: None  . Alcohol Use: No  . Drug Use: No  . Sexual Activity: Yes    Birth Control/ Protection: Post-menopausal   Other Topics Concern  . None   Social History Narrative   Lives   Caffeine use:   History reviewed. No pertinent family history. Scheduled Meds: . amantadine  100 mg Oral Daily  . aspirin EC  81 mg Oral Daily  . cefTRIAXone (ROCEPHIN) IM  1 g Intramuscular Q24H  . enoxaparin (LOVENOX) injection  40 mg Subcutaneous Q24H  . FLUoxetine  10 mg Oral Daily  . lactulose  20 g Oral BID  . lamoTRIgine  200 mg Oral Daily  . metoprolol tartrate  12.5 mg Oral BID  . midazolam  2 mg Intravenous Once  . pantoprazole  40 mg Oral Daily  . sodium chloride  3 mL Intravenous Q12H   Continuous Infusions: . sodium chloride 0.45 % with kcl 100 mL/hr at 06/19/15 1415   PRN Meds:.ondansetron (ZOFRAN) IV Medications Prior to Admission:  Prior to Admission medications   Medication Sig Start Date End Date Taking? Authorizing Provider  acetaminophen (TYLENOL) 325 MG tablet Take 650 mg by mouth every 6 (six) hours as needed for headache.    Yes Historical Provider, MD  amantadine (SYMMETREL) 100 MG capsule Take 100 mg by mouth 2 (two) times daily.   Yes Historical Provider, MD  antiseptic oral rinse (BIOTENE) LIQD 5 mLs by Mouth Rinse route every 4 (four) hours as needed for dry mouth.   Yes Historical Provider, MD  aspirin EC  81 MG tablet Take 81 mg by mouth daily.   Yes Historical Provider, MD  buPROPion (WELLBUTRIN XL) 300 MG 24 hr tablet Take 300 mg by mouth daily.   Yes Historical Provider, MD  FLUoxetine (PROZAC) 20 MG capsule Take 20 mg by mouth daily.   Yes Historical Provider, MD  gabapentin (NEURONTIN) 100 MG capsule Take 2 capsules (200 mg total) by mouth 3 (three) times daily. 06/03/15  Yes Velvet Bathe, MD  lamoTRIgine (LAMICTAL) 200 MG tablet Take 200 mg by mouth daily.   Yes Historical Provider, MD  magic mouthwash SOLN Take 10 mLs by mouth 4 (four) times daily as needed for mouth pain.   Yes Historical Provider, MD  Melatonin 5 MG TABS Take 5 mg by mouth at bedtime.   Yes Historical Provider, MD  metoprolol tartrate (LOPRESSOR) 25 MG tablet Take 12.5 mg by mouth daily. Hold for SBP < 110 and HR <60   Yes Historical Provider, MD  Multiple Vitamins-Minerals (MULTIVITAMIN WITH MINERALS) tablet Take 1 tablet by mouth daily.  Yes Historical Provider, MD  omeprazole (PRILOSEC) 40 MG capsule Take 40 mg by mouth daily.   Yes Historical Provider, MD  saccharomyces boulardii (FLORASTOR) 250 MG capsule Take 250 mg by mouth 2 (two) times daily.   Yes Historical Provider, MD  simvastatin (ZOCOR) 20 MG tablet Take 20 mg by mouth daily.   Yes Historical Provider, MD   Allergies  Allergen Reactions  . Clindamycin/Lincomycin Anaphylaxis  . Codeine Other (See Comments)    Listed on MAR  . Glipizide Other (See Comments)    Listed on MAR  . Tetracyclines & Related Other (See Comments)    Listed on MAR    Review of Systems  Unable to perform ROS: Patient nonverbal  :  Unable to obtain as the patient is unable to communicate.  Physical Exam  Constitutional: She appears well-nourished.  Acutely ill appearing female  HENT:  Scratch to left eye  Respiratory: Effort normal.  GI: Soft.  Musculoskeletal:  Increased tone  Neurological:  Non verbal. Opens eyes to voice. Does not follow commands Psychomotor  agitation observed  Skin:  War, dry. Multiple bruises to upper and lower extremeites        Vital Signs: BP 152/54 mmHg  Pulse 80  Temp(Src) 98.2 F (36.8 C) (Axillary)  Resp 20  Ht 5' (1.524 m)  Wt 86.1 kg (189 lb 13.1 oz)  BMI 37.07 kg/m2  SpO2 98%  SpO2: SpO2: 98 % O2 Device:SpO2: 98 % O2 Flow Rate: .   IO: Intake/output summary: No intake or output data in the 24 hours ending 06/19/15 1445  LBM: Last BM Date: 06/18/15 Baseline Weight: Weight: 86.1 kg (189 lb 13.1 oz) Most recent weight: Weight: 86.1 kg (189 lb 13.1 oz)      Palliative Assessment/Data:  Flowsheet Rows        Most Recent Value   Intake Tab    Referral Department  Hospitalist   Unit at Time of Referral  Cardiac/Telemetry Unit   Palliative Care Primary Diagnosis  Other (Comment)   Date Notified  06/19/15   Palliative Care Type  New Palliative care   Reason for referral  Non-pain Symptom   Date of Admission  06/12/15   Date first seen by Palliative Care  06/19/15   # of days Palliative referral response time  0 Day(s)   # of days IP prior to Palliative referral  7   Clinical Assessment    Palliative Performance Scale Score  20%   Anxiety Max Last 24 Hours  10   Anxiety Min Last 24 Hours  4   Psychosocial & Spiritual Assessment    Palliative Care Outcomes    Patient/Family meeting held?  No      Additional Data Reviewed:  CBC:    Component Value Date/Time   WBC 4.9 06/19/2015 0535   HGB 12.4 06/19/2015 0535   HCT 39.3 06/19/2015 0535   PLT 159 06/19/2015 0535   MCV 96.1 06/19/2015 0535   NEUTROABS 4.6 06/12/2015 1153   LYMPHSABS 2.1 06/12/2015 1153   MONOABS 0.7 06/12/2015 1153   EOSABS 0.2 06/12/2015 1153   BASOSABS 0.1 06/12/2015 1153   Comprehensive Metabolic Panel:    Component Value Date/Time   NA 151* 06/19/2015 0535   K 3.4* 06/19/2015 0535   CL 113* 06/19/2015 0535   CO2 21* 06/19/2015 0535   BUN 16 06/19/2015 0535   CREATININE 1.17* 06/19/2015 0535   GLUCOSE 119*  06/19/2015 0535   CALCIUM 10.0 06/19/2015 0535  AST 20 06/19/2015 0535   ALT 18 06/19/2015 0535   ALKPHOS 58 06/19/2015 0535   BILITOT 0.9 06/19/2015 0535   PROT 6.8 06/19/2015 0535   ALBUMIN 3.6 06/19/2015 0535     Time In: 1530 Time Out: 1700 Time Total: 90 min Greater than 50%  of this time was spent counseling and coordinating care related to the above assessment and plan. Staffed with Dr. Allyson Sabal  Signed by: Melton Alar, PA-C  Melton Alar, PA-C  06/19/2015, 2:45 PM  Please contact Palliative Medicine Team phone at [phone] for questions and concerns.

## 2015-06-19 NOTE — Progress Notes (Signed)
Physical Therapy Treatment Patient Details Name: Alexis Thompson MRN: [id number] DOB: 1940/08/28 Today's Date: 06/19/2015    History of Present Illness 75 yo female admitted with deterioration of mental status, hallucinations, tremors/myoclonic movements. Hx of bipolar d/o, DM, HTN, psychosis, suical ideation/attempt. Pt is from SNF    PT Comments    Patient's tremors and incr tone worse today compared to last PT visit 06/16/15. Pt previously answering in 1-2 words, however today unable to form any intelligible words. Performed bed mobility to change linens and reposition up in bed away from rails. RECOMMEND pads for bedrails to reduce risk of injury and asked RN to follow-up with this. Will continue to follow and proceed with PT as pt can participate/benefit.   Follow Up Recommendations  SNF;Supervision/Assistance - 24 hour     Equipment Recommendations  None recommended by PT    Recommendations for Other Services       Precautions / Restrictions Precautions Precautions: Fall Restrictions Weight Bearing Restrictions: No    Mobility  Bed Mobility Overal bed mobility: Needs Assistance;+2 for physical assistance;+ 2 for safety/equipment Bed Mobility: Rolling Rolling: Max assist;+2 for physical assistance         General bed mobility comments: on arrival pt lying crosswise in bed (curled up with head against rail); incr tremors/tone in all 4 extremities; assisted with bending knees and rolling Rt and Lt for removal of wet linens and replacing dry; total assist to scoot up to [facility]  Transfers                 General transfer comment: unsafe to attempt due to extensor tone  Ambulation/Gait                 Stairs            Wheelchair Mobility    Modified Rankin (Stroke Patients Only)       Balance                                    Cognition Arousal/Alertness:  (awake/eyes open/not speaking) Behavior During Therapy:  Anxious;Restless Overall Cognitive Status: Difficult to assess                      Exercises      General Comments General comments (skin integrity, edema, etc.): Son arrived during session; apparently her condition is not unfamiliar to him. Pt had wiggled her hand out of Lt mitt (attached around her wrist only). Hand tightly fisted, however able to abdct and extend her thumb with remainging fingers relaxing enough to open hand and replace mitt.       Pertinent Vitals/Pain Pain Assessment: Faces Faces Pain Scale: Hurts even more Pain Location: unable to indicate; ?anxiety Pain Descriptors / Indicators: Grimacing;Moaning Pain Intervention(s): Limited activity within patient's tolerance;Repositioned;Other (comment) (asked RN about getting pads for bed rails)    Home Living                      Prior Function            PT Goals (current goals can now be found in the care plan section) Acute Rehab PT Goals Patient Stated Goal: unable to state Time For Goal Achievement: 06/30/15 Progress towards PT goals: Not progressing toward goals - comment (medical decline)    Frequency  Min 2X/week    PT Plan Other (comment) (will continue  to assess ability to participate)    Co-evaluation             End of Session   Activity Tolerance: Treatment limited secondary to medical complications (Comment) Patient left: in bed;with call bell/phone within reach;with family/visitor present;with restraints reapplied (bil mittens)     Time: [id number] PT Time Calculation (min) (ACUTE ONLY): 15 min  Charges:  $Therapeutic Activity: 8-22 mins                    G Codes:      Matthias Bogus 06/19/2015, 10:15 AM Pager [phone]

## 2015-06-19 NOTE — Progress Notes (Signed)
Patient to be transferred to 6N. Report called to the receiving RN.

## 2015-06-19 NOTE — Progress Notes (Signed)
EEG Completed; Results Pending  

## 2015-06-20 MED ORDER — HYDROMORPHONE HCL 1 MG/ML IJ SOLN
0.5000 mg | INTRAMUSCULAR | Status: DC | PRN
  Administered 2015-06-20 – 2015-06-21 (×4): 1 mg via INTRAVENOUS
  Administered 2015-06-21: 0.5 mg via INTRAVENOUS
  Administered 2015-06-22: 1 mg via INTRAVENOUS
  Filled 2015-06-20 (×9): qty 1

## 2015-06-20 NOTE — Progress Notes (Signed)
Pt had some agitation in the night per conversation with Chyrl CivatteJoann, Charity fundraiserN. Haldol was reportedly not very effective but dilaudid did seem to help. She is currently resting comfortably. Still not following instructions or able to take anything by mouth. Will increase hydromorphone range and frequency. Instructed RN to try hydromorphone first and to also try PRN valium if pt becomes agitated. Eduard RouxSarah Ramsha Lonigro, ANP

## 2015-06-20 NOTE — Progress Notes (Addendum)
[facility] PROGRESS NOTE  Edwena Feltyatsy Maragh ZOX:[id number] DOB: [birth year] DOA: 06/12/2015 PCP: Oneal GroutPANDEY, MAHIMA, MD  HPI : [name] is a 75 year old female with a past medical history of bipolar disorder, progressive cognitive disorder, having recent hospitalization [facility] from 05/27/2015 through 06/03/2015 where she was treated for sepsis in setting of peritonitis/urinary tract infection. At the time she presented with acute encephalopathy. During that hospitalization she was evaluated by Dr. Amada JupiterKirkpatrick of neurology suspecting delirium. [facility] with complaints of worsening tremors associated with agitation, visual hallucinations. He was seen and evaluated by neurology at [facility] who [facility].   Assessment/Plan: 1. 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 She had a recent hospitalization at [facility] where CT scan of brain was unremarkable. -At discharge from her last hospitalization she was given trial of gabapentin that had resulted in improvement to tremors. -Family reporting that tremors came back several days ago -Unclear underlying infectious processes precipitating abnormal movements or if there is a primary underlying neurologic condition. Repeat UA 1/3 slightly positive, will check urine culture and start Rocephin -She was seen and evaluated by neurology. Dr Cyril Mourningamillo [facility] to undergo further workup with EEG and MRI. MRI of the brain was negative, chronic microvascular ischemic changes with global atrophy She  was initially started on amantadine 100 mg twice a day, gabapentin 300 mg 3 times a day, Lamictal 200 mg daily  and Wellbutrin 300 mg by mouth daily, Depakote 500 mg PO BID  Subsequently followed by Dr. Lavon PaganiniNandigam, according to him Depakote may result in supratherapeutic levels of Lamictal, therefore Depakote and Wellbutrin discontinued, patient started on Klonopin by neurology . On 1/5 family requested to discontinue Klonopin given prior abnormal reaction to benzodiazepines in the past Abnormal EEG due to the presence of generalized slowing indicating a moderate to severe cerebral disturbance (encephalopathy). No epileptiform activity noted. Neurology recommended to repeat EEG 1/6, neurology  Reevaluated  the patient   provided prognostic information to the family Family agreeable to palliative care meeting and would like to know the recommendations   PT OT evaluation recommended SNF  neurology has discontinued Neurontin, and also decrease the dose of Prozac Ammonia level initially at 52, now improving on lactulose.     UA is abnormal, however, not truly suggestive of an infection.  Started Rocephin for possible UTI and DC if urine culture negative on 1/7 Also suspect a component of vascular dementia given global atrophy and chronic microvascular ischemic changes Palliative care consult for GOC  Hypernatremia  Worsening, further complicating the picture,  Causing metabolic encephalopathy Patient now on comfort care measures, discontinue above medications Currently receiving Valium, glycopyrrolate, Haldol, Dilaudid for comfort   Goals of care Comfort care No artifical feeding  No further abx, no labs  KVO fluids Comfort medicines to manage any symptoms  monitor over the weeknd   2. History of sepsis.  -She was recently hospitalized for sepsis in setting of urinary tract infection and parotiditis, discharged on Augmentin which she completed. -Initial labs showing a normal white count of 7500, lactic acid of 1.22, urinalysis negative, with chest x-ray not showing acute consolidation.  Repeat UA  1/3  shows  Possible UTI-started Rocephin 1/5-1/6   3.  Bipolar disorder -For  now will continue Lamictal and fluoxetine.   4.  Acute kidney injury. -Initial labs showing creatinine of 1.3, , decreasing > 1.06. Likely secondary to prerenal azotemia.    5. Poor oral intake/hypernatremia/hypokalemia IV fluids are discontinued     Code Status: Full code Family Communication: We have been in touch with Anthony Sar to give updates. Her telephone # [phone]. Eunice Blase is contact person and wishes to be updated daily.   Disposition Plan: Now on full comfort care measures,   Consultants: Also suspect a component of vascular dementia  Neurology   Subjective: Appears to be comfortable  Objective: Filed Vitals:   06/19/15 1829 06/20/15 0312  BP: 149/69 147/115  Pulse: 83 90  Temp: 98.6 F (37 C) 98.1 F (36.7 C)  Resp: 20 18   No intake or output data in the 24 hours ending 06/20/15 1134 Filed Weights   06/13/15 0544 06/15/15 1500  Weight: 86.1 kg (189 lb 13.1 oz) 86.1 kg (189 lb 13.1 oz)    Exam:   General:  Somnolent but arousable, lethargic unable to follow commands  Cardiovascular: Regular rate and rhythm normal S1-S2  Respiratory: Diminished air entry at the bases. No crackles or wheezing.  Abdomen: Soft nontender nondistended  Musculoskeletal: No edema Neurological:Motor: Moving all extremities antigravity with myoclonic movents greater in face and bilateral UE. More prominent when aggrivated.   Sensory: withdraws from pain throughout  Data Reviewed: Basic Metabolic Panel:  Recent Labs Lab 06/15/15 0357 06/16/15 0644 06/17/15 0740 06/18/15 0400 06/19/15 0535  NA 145 145 148* 148* 151*  K 3.5 3.7 3.6 3.5 3.4*  CL 109 109 112* 110 113*  CO2 23 23 22 22  21*  GLUCOSE 82 84 85 83 119*  BUN 13 12 14 15 16   CREATININE 1.25* 1.06* 1.02* 1.15* 1.17*  CALCIUM 9.3 9.3 9.7 9.7 10.0   Liver Function Tests:  Recent Labs Lab 06/16/15 0644  06/17/15 0740 06/18/15 0400 06/19/15 0535  AST 19 19 19 20   ALT 17 16 17 18   ALKPHOS 52 56 57 58  BILITOT 0.9 0.9 1.0 0.9  PROT 6.0* 6.4* 6.3* 6.8  ALBUMIN 3.0* 3.2* 3.5 3.6   No results for input(s): LIPASE, AMYLASE in the last 168 hours.  Recent Labs Lab 06/15/15 0900 06/16/15 1126 06/17/15 0730 06/18/15 0400 06/19/15 0535  AMMONIA 40* 52* 39* 37* 39*   CBC:  Recent Labs Lab 06/14/15 0230 06/15/15 0357 06/17/15 0740 06/18/15 0400 06/19/15 0535  WBC 5.4 5.1 4.7 4.7 4.9  HGB 11.6* 11.2* 12.3 11.9* 12.4  HCT 36.0 35.4* 38.7 36.7 39.3  MCV 97.6 96.2 95.6 95.3 96.1  PLT 192 179 179 169 159   Cardiac Enzymes:  Recent Labs Lab 06/15/15 0900  CKTOTAL 93   CBG:  Recent Labs Lab 06/13/15 1619 06/14/15 1755  GLUCAP 86 73    Recent Results (from the past 240 hour(s))  Culture, blood (routine x 2)     Status: None   Collection Time: 06/12/15 11:52 AM  Result Value Ref Range Status   Specimen Description BLOOD BLOOD RIGHT HAND  Final   Special Requests BOTTLES DRAWN AEROBIC AND ANAEROBIC 5CC  Final   Culture   Final    NO GROWTH 5 DAYS Performed at [facility]    Report Status 06/17/2015 FINAL  Final  Culture, blood (routine x 2)     Status: None   Collection Time: 06/12/15 11:52 AM  Result Value Ref Range Status   Specimen Description BLOOD BLOOD  LEFT HAND  Final   Special Requests IN PEDIATRIC BOTTLE 1CC  Final   Culture   Final    NO GROWTH 5 DAYS Performed at [facility]    Report Status 06/17/2015 FINAL  Final  Urine culture     Status: None   Collection Time: 06/12/15 11:57 AM  Result Value Ref Range Status   Specimen Description URINE, CATHETERIZED  Final   Special Requests NONE  Final   Culture   Final    NO GROWTH 1 DAY Performed at [facility]    Report Status 06/13/2015 FINAL  Final  MRSA PCR Screening     Status: Abnormal   Collection Time: 06/13/15  4:08 AM  Result Value Ref Range Status   MRSA by PCR  POSITIVE (A) NEGATIVE Final    Comment:        The GeneXpert MRSA Assay (FDA approved for NASAL specimens only), is one component of a comprehensive MRSA colonization surveillance program. It is not intended to diagnose MRSA infection nor to guide or monitor treatment for MRSA infections. RESULT CALLED TO, READ BACK BY AND VERIFIED WITH: [employer] @0554  06/13/15 MKELLY      Studies: No results found.  Scheduled Meds: . sodium chloride  3 mL Intravenous Q12H   Continuous Infusions: . sodium chloride 0.45 % with kcl 100 mL/hr at 06/19/15 1415    Principal Problem:   Bipolar I disorder (HCC) Active Problems:   Tremor   Acathisia   Altered mental state   Myoclonus   Acute encephalopathy   Palliative care encounter    Time spent: 25 min    [facility]  [facility] Pager [phone]   If 7PM-7AM, please contact night-coverage at [email or web address], password [facility] 06/20/2015, 11:34 AM  LOS: 8 days

## 2015-06-21 MED ORDER — POLYVINYL ALCOHOL 1.4 % OP SOLN
2.0000 [drp] | Freq: Three times a day (TID) | OPHTHALMIC | Status: DC
  Administered 2015-06-21 – 2015-06-22 (×4): 2 [drp] via OPHTHALMIC
  Filled 2015-06-21: qty 15

## 2015-06-21 MED ORDER — DIAZEPAM 5 MG/ML IJ SOLN
5.0000 mg | Freq: Three times a day (TID) | INTRAMUSCULAR | Status: DC
  Administered 2015-06-21 – 2015-06-22 (×4): 5 mg via INTRAVENOUS
  Filled 2015-06-21 (×4): qty 2

## 2015-06-21 MED ORDER — HYDROMORPHONE HCL 1 MG/ML IJ SOLN
0.5000 mg | INTRAMUSCULAR | Status: DC
  Administered 2015-06-21 – 2015-06-22 (×5): 0.5 mg via INTRAVENOUS
  Filled 2015-06-21 (×2): qty 1

## 2015-06-21 NOTE — Progress Notes (Addendum)
[facility] PROGRESS NOTE  Skyleigh Windle ONG:[id number] DOB: 09/10/1940 DOA: 06/12/2015 PCP: Oneal Grout, MD  HPI : Mrs. Johndrow is a 75 year old female with a past medical history of bipolar disorder, progressive cognitive disorder, having recent hospitalization at [facility] from 05/27/2015 through 06/03/2015 where she was treated for sepsis in setting of peritonitis/urinary tract infection. At the time she presented with acute encephalopathy. During that hospitalization she was evaluated by Dr. Amada Jupiter of neurology suspecting delirium. Presented to the emergency department at [facility] with complaints of worsening tremors associated with agitation, visual hallucinations. He was seen and evaluated by neurology at [facility] who recommended transfer to [facility] for further workup.   Assessment/Plan: 1. 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 She had a recent hospitalization at [facility] where CT scan of brain was unremarkable. -At discharge from her last hospitalization she was given trial of gabapentin that had resulted in improvement to tremors. -Family reporting that tremors came back several days ago -Unclear underlying infectious processes precipitating abnormal movements or if there is a primary underlying neurologic condition. Repeat UA 1/3 slightly positive, will check urine culture and start Rocephin -She was seen and evaluated by neurology. Dr Cyril Mourning recommended transfer to [facility] to undergo further workup with EEG and MRI. MRI of the brain was negative, chronic microvascular ischemic changes with global atrophy She  was initially started on amantadine 100 mg twice a day, gabapentin 300 mg 3 times a day, Lamictal 200 mg daily  and Wellbutrin 300 mg by mouth daily, Depakote 500 mg PO BID  Subsequently followed by Dr. Lavon Paganini, according to him Depakote may result in supratherapeutic levels of Lamictal, therefore Depakote and Wellbutrin discontinued, patient started on Klonopin by neurology . On 1/5 family requested to discontinue Klonopin given prior abnormal reaction to benzodiazepines in the past Abnormal EEG due to the presence of generalized slowing indicating a moderate to severe cerebral disturbance (encephalopathy). No epileptiform activity noted. Neurology recommended to repeat EEG 1/6, neurology  Reevaluated  the patient   provided prognostic information to the family Family agreeable to palliative care meeting and would like to know the recommendations   PT OT evaluation recommended SNF  neurology has discontinued Neurontin, and also decrease the dose of Prozac Ammonia level initially at 52, now improving on lactulose.     UA is abnormal, however, not truly suggestive of an infection.  Started Rocephin for possible UTI and DC if urine culture negative on 1/7 Also suspect a component of vascular dementia given global atrophy and chronic microvascular ischemic changes Palliative care consult for GOC  Hypernatremia  Worsening, further complicating the picture,  Causing metabolic encephalopathy Patient now on comfort care measures,  Currently receiving Valium, glycopyrrolate, Haldol, Dilaudid for comfort   Goals of care Comfort care No artifical feeding  No further abx, no labs  KVO fluids Comfort medicines to manage any symptoms monitor over the weeknd   2. History of sepsis.  -She was recently hospitalized for sepsis in setting of urinary tract infection and parotiditis, discharged on Augmentin which she completed. -Initial labs showing a normal white count of 7500, lactic acid of 1.22, urinalysis negative, with chest x-ray not showing acute consolidation.  Repeat UA  1/3 shows  Possible UTI-started  Rocephin 1/5-1/6   3.  Bipolar disorder -For now will continue  Lamictal and fluoxetine.   4.  Acute kidney injury. -Initial labs showing creatinine of 1.3, , decreasing > 1.06. Likely secondary to prerenal azotemia.    5. Poor oral intake/hypernatremia/hypokalemia IV fluids are discontinued     Code Status: Full code Family Communication: Discussed with Anthony SarDebbie Phillips to give updates. Her telephone # [phone]. Eunice BlaseDebbie is contact person and wishes to be updated daily.   Disposition Plan: Now on full comfort care measures,   likely discharge to hospice home tomorrow if palliative care recommends to do so   Consultants: Also suspect a component of vascular dementia  Palliative care  Neurology  Psychiatry   Subjective: Appears to have intermittent confusion, vitals stable, noncommunicative  Objective: Filed Vitals:   06/20/15 0312 06/21/15 0500  BP: 147/115 162/88  Pulse: 90 89  Temp: 98.1 F (36.7 C) 99 F (37.2 C)  Resp: 18 17    Intake/Output Summary (Last 24 hours) at 06/21/15 1151 Last data filed at 06/21/15 1147  Gross per 24 hour  Intake      0 ml  Output    200 ml  Net   -200 ml   Filed Weights   06/13/15 0544 06/15/15 1500  Weight: 86.1 kg (189 lb 13.1 oz) 86.1 kg (189 lb 13.1 oz)    Exam:   General:  Somnolent but arousable, lethargic unable to follow commands  Cardiovascular: Regular rate and rhythm normal S1-S2  Respiratory: Diminished air entry at the bases. No crackles or wheezing.  Abdomen: Soft nontender nondistended  Musculoskeletal: No edema Neurological:Motor: Moving all extremities antigravity with myoclonic movents greater in face and bilateral UE. More prominent when aggrivated.   Sensory: withdraws from pain throughout  Data Reviewed: Basic Metabolic Panel:  Recent Labs Lab 06/15/15 0357 06/16/15 0644 06/17/15 0740 06/18/15 0400 06/19/15 0535  NA 145 145 148* 148* 151*  K 3.5 3.7 3.6 3.5 3.4*  CL 109 109  112* 110 113*  CO2 23 23 22 22  21*  GLUCOSE 82 84 85 83 119*  BUN 13 12 14 15 16   CREATININE 1.25* 1.06* 1.02* 1.15* 1.17*  CALCIUM 9.3 9.3 9.7 9.7 10.0   Liver Function Tests:  Recent Labs Lab 06/16/15 0644 06/17/15 0740 06/18/15 0400 06/19/15 0535  AST 19 19 19 20   ALT 17 16 17 18   ALKPHOS 52 56 57 58  BILITOT 0.9 0.9 1.0 0.9  PROT 6.0* 6.4* 6.3* 6.8  ALBUMIN 3.0* 3.2* 3.5 3.6   No results for input(s): LIPASE, AMYLASE in the last 168 hours.  Recent Labs Lab 06/15/15 0900 06/16/15 1126 06/17/15 0730 06/18/15 0400 06/19/15 0535  AMMONIA 40* 52* 39* 37* 39*   CBC:  Recent Labs Lab 06/15/15 0357 06/17/15 0740 06/18/15 0400 06/19/15 0535  WBC 5.1 4.7 4.7 4.9  HGB 11.2* 12.3 11.9* 12.4  HCT 35.4* 38.7 36.7 39.3  MCV 96.2 95.6 95.3 96.1  PLT 179 179 169 159   Cardiac Enzymes:  Recent Labs Lab 06/15/15 0900  CKTOTAL 93   CBG:  Recent Labs Lab 06/14/15 1755  GLUCAP 73    Recent Results (from the past 240 hour(s))  Culture, blood (routine x 2)     Status: None   Collection Time: 06/12/15 11:52 AM  Result Value Ref Range Status   Specimen Description BLOOD BLOOD RIGHT HAND  Final   Special Requests BOTTLES DRAWN AEROBIC AND ANAEROBIC 5CC  Final   Culture   Final    NO GROWTH 5 DAYS [facility]  Report Status 06/17/2015 FINAL  Final  Culture, blood (routine x 2)     Status: None   Collection Time: 06/12/15 11:52 AM  Result Value Ref Range Status   Specimen Description BLOOD BLOOD LEFT HAND  Final   Special Requests IN PEDIATRIC BOTTLE 1CC  Final   Culture   Final    NO GROWTH 5 DAYS Performed at [facility]    Report Status 06/17/2015 FINAL  Final  Urine culture     Status: None   Collection Time: 06/12/15 11:57 AM  Result Value Ref Range Status   Specimen Description URINE, CATHETERIZED  Final   Special Requests NONE  Final   Culture   Final    NO GROWTH 1 DAY Performed at [facility]    Report  Status 06/13/2015 FINAL  Final  MRSA PCR Screening     Status: Abnormal   Collection Time: 06/13/15  4:08 AM  Result Value Ref Range Status   MRSA by PCR POSITIVE (A) NEGATIVE Final    Comment:        The GeneXpert MRSA Assay (FDA approved for NASAL specimens only), is one component of a comprehensive MRSA colonization surveillance program. It is not intended to diagnose MRSA infection nor to guide or monitor treatment for MRSA infections. RESULT CALLED TO, READ BACK BY AND VERIFIED WITH: [facility] @0554  06/13/15 MKELLY      Studies: No results found.  Scheduled Meds: . sodium chloride  3 mL Intravenous Q12H   Continuous Infusions: . sodium chloride 0.45 % with kcl 10 mL/hr at 06/20/15 1800    Principal Problem:   Bipolar I disorder (HCC) Active Problems:   Tremor   Acathisia   Altered mental state   Myoclonus   Acute encephalopathy   Palliative care encounter    Time spent: 25 min    [facility]  [facility] Pager [phone]   If 7PM-7AM, please contact night-coverage at [email or web address], password [facility] 06/21/2015, 11:51 AM  LOS: 9 days

## 2015-06-21 NOTE — Progress Notes (Addendum)
Daily Progress Note   Patient Name: Alexis Thompson       Date: 06/21/2015 DOB: June 25, 1940  Age: 75 y.o. MRN#: [id number] Attending Physician: Richarda Overlie, MD Primary Care Physician: Oneal Grout, MD Admit Date: 06/12/2015  Reason for Consultation/Follow-up: Non-pain, pain symptom mgt and in-pt hospice assessment  Subjective:Pt continues to decline. Transitioning towards EOL. Taking nothing by mouth. Respirations irrg and labored at times. She is still having some involuntary movements and has received prn valium as well as hydromorphone with benefit. She opens her eyes to voice. Appears frightened and startled. Updated daughter, Mrs. Warren Danes on phone  Interval Events: Further decline Length of Stay: 9 days  Current Medications: Scheduled Meds:  . diazepam  5 mg Intravenous 3 times per day  .  HYDROmorphone (DILAUDID) injection  0.5 mg Intravenous 6 times per day  . polyvinyl alcohol  2 drop Both Eyes 3 times per day  . sodium chloride  3 mL Intravenous Q12H    Continuous Infusions:    PRN Meds: diazepam, glycopyrrolate, haloperidol lactate, HYDROmorphone (DILAUDID) injection, ondansetron (ZOFRAN) IV  Physical Exam: Physical Exam  Constitutional: She appears well-nourished.  HENT:  Head: Normocephalic.  Cardiovascular: Normal rate and regular rhythm.   Pulmonary/Chest:  Increased work of breathing,irrg  Musculoskeletal:  Some generalized myoclonus observed but not sustained  Neurological:  Opens eyes to voice. Non-verbal  Skin: Skin is warm and dry.  Ulceration to top of right foot  Psychiatric:  Looks startled, frightened when you call her name                Vital Signs: BP 162/88 mmHg  Pulse 89  Temp(Src) 99 F (37.2 C) (Axillary)  Resp 17  Ht 5'  (1.524 m)  Wt 86.1 kg (189 lb 13.1 oz)  BMI 37.07 kg/m2  SpO2 91% SpO2: SpO2: 91 % O2 Device: O2 Device: Not Delivered O2 Flow Rate:    Intake/output summary:  Intake/Output Summary (Last 24 hours) at 06/21/15 1416 Last data filed at 06/21/15 1147  Gross per 24 hour  Intake      0 ml  Output    200 ml  Net   -200 ml   LBM: Last BM Date: 06/19/15 Baseline Weight: Weight: 86.1 kg (189 lb 13.1 oz) Most recent weight: Weight: 86.1 kg (189 lb 13.1  oz)       Palliative Assessment/Data: Flowsheet Rows        Most Recent Value   Intake Tab    Referral Department  Hospitalist   Unit at Time of Referral  Cardiac/Telemetry Unit   Palliative Care Primary Diagnosis  Other (Comment)   Date Notified  06/19/15   Palliative Care Type  New Palliative care   Reason for referral  Non-pain Symptom   Date of Admission  06/12/15   Date first seen by Palliative Care  06/19/15   # of days Palliative referral response time  0 Day(s)   # of days IP prior to Palliative referral  7   Clinical Assessment    Palliative Performance Scale Score  40%   Anxiety Max Last 24 Hours  10   Anxiety Min Last 24 Hours  4   Psychosocial & Spiritual Assessment    Palliative Care Outcomes    Patient/Family meeting held?  No      Additional Data Reviewed: CBC    Component Value Date/Time   WBC 4.9 06/19/2015 0535   RBC 4.09 06/19/2015 0535   HGB 12.4 06/19/2015 0535   HCT 39.3 06/19/2015 0535   PLT 159 06/19/2015 0535   MCV 96.1 06/19/2015 0535   MCH 30.3 06/19/2015 0535   MCHC 31.6 06/19/2015 0535   RDW 16.4* 06/19/2015 0535   LYMPHSABS 2.1 06/12/2015 1153   MONOABS 0.7 06/12/2015 1153   EOSABS 0.2 06/12/2015 1153   BASOSABS 0.1 06/12/2015 1153    CMP     Component Value Date/Time   NA 151* 06/19/2015 0535   K 3.4* 06/19/2015 0535   CL 113* 06/19/2015 0535   CO2 21* 06/19/2015 0535   GLUCOSE 119* 06/19/2015 0535   BUN 16 06/19/2015 0535   CREATININE 1.17* 06/19/2015 0535   CALCIUM 10.0  06/19/2015 0535   PROT 6.8 06/19/2015 0535   ALBUMIN 3.6 06/19/2015 0535   AST 20 06/19/2015 0535   ALT 18 06/19/2015 0535   ALKPHOS 58 06/19/2015 0535   BILITOT 0.9 06/19/2015 0535   GFRNONAA 45* 06/19/2015 0535   GFRAA 53* 06/19/2015 0535       Problem List:  Patient Active Problem List   Diagnosis Date Noted  . Palliative care encounter   . Acute encephalopathy   . Altered mental state   . Myoclonus   . Acathisia 06/12/2015  . AKI (acute kidney injury) (HCC)   . Confusion   . Cellulitis, neck 05/27/2015  . Sepsis (HCC) 05/27/2015  . Tremor 05/27/2015  . Allergic reaction   . Bipolar I disorder (HCC) 04/27/2015  . Drug induced akathisia 04/27/2015  . Essential hypertension 04/27/2015  . Anxiety 04/27/2015  . Hyperlipidemia 04/27/2015  . Neuropathy (HCC) 04/27/2015  . Insomnia 04/27/2015  . GERD (gastroesophageal reflux disease) 04/27/2015  . Overactive bladder 04/27/2015  . Right hand/wrist pain 04/27/2015     Palliative Care Assessment & Plan    1.Code Status:  DNR    Code Status Orders        Start     Ordered   06/19/15 1706  Do not attempt resuscitation (DNR)   Continuous    Question Answer Comment  In the event of cardiac or respiratory ARREST Do not call a "code blue"   In the event of cardiac or respiratory ARREST Do not perform Intubation, CPR, defibrillation or ACLS   In the event of cardiac or respiratory ARREST Use medication by any route, position, wound care, and  other measures to relive pain and suffering. May use oxygen, suction and manual treatment of airway obstruction as needed for comfort.      06/19/15 1705       2. Goals of Care/Additional Recommendations:  Transfer to in-pt hospice. Sw consult in place. Daughter in agreement  Limitations on Scope of Treatment: Full Comfort Care  Desire for further Chaplaincy support:no  Psycho-social Needs: Grief/Bereavement Support  3. Symptom Management:      1.Pain: Will sch  hydromorphone 0.5 q4 atc and prn      2. Dyspnea: Will scheduled hydromorphone. See above      3. Anxiety/involuntary movements: Schedule valium 5mg  q8 atc and prn  4. Palliative Prophylaxis:   Bowel Regimen, Delirium Protocol, Frequent Pain Assessment and Turn Reposition  5. Prognosis: Hours - Days  6. Discharge Planning:  Hospice facility   Care plan was discussed with Dr. Susie Cassette  Thank you for allowing the Palliative Medicine Team to assist in the care of this patient.   Time In: 1120 Time Out: 1145 Total Time 25 min Prolonged Time Billed  no         Irean Hong, NP  06/21/2015, 2:16 PM  Please contact Palliative Medicine Team phone at [phone] for questions and concerns.

## 2015-06-22 MED ORDER — LORAZEPAM 2 MG/ML PO CONC: 2.0000 mg | ORAL | Status: AC | PRN

## 2015-06-22 MED ORDER — DIAZEPAM 5 MG/ML IJ SOLN: 5.0000 mg | INTRAMUSCULAR | Status: DC | PRN

## 2015-06-22 MED ORDER — HYDROMORPHONE HCL 1 MG/ML IJ SOLN: 1.0000 mg | INTRAMUSCULAR | Status: DC

## 2015-06-22 MED ORDER — HYDROMORPHONE HCL 1 MG/ML IJ SOLN: 1.0000 mg | INTRAMUSCULAR | Status: DC | PRN

## 2015-06-22 MED ORDER — HYDROMORPHONE HCL 1 MG/ML IJ SOLN
1.0000 mg | INTRAMUSCULAR | Status: DC
  Administered 2015-06-22: 1 mg via INTRAVENOUS
  Filled 2015-06-22 (×2): qty 1

## 2015-06-22 MED ORDER — CETYLPYRIDINIUM CHLORIDE 0.05 % MT LIQD: 7.0000 mL | Freq: Two times a day (BID) | OROMUCOSAL | Status: DC

## 2015-06-22 MED ORDER — HYDROMORPHONE HCL 1 MG/ML IJ SOLN
1.0000 mg | INTRAMUSCULAR | Status: AC
  Administered 2015-06-22: 1 mg via INTRAVENOUS

## 2015-06-22 MED ORDER — MORPHINE SULFATE (CONCENTRATE) 10 MG /0.5 ML PO SOLN: 5.0000 mg | ORAL | Status: AC | PRN

## 2015-06-22 MED ORDER — CHLORHEXIDINE GLUCONATE 0.12 % MT SOLN
15.0000 mL | Freq: Two times a day (BID) | OROMUCOSAL | Status: DC
  Administered 2015-06-22: 15 mL via OROMUCOSAL
  Filled 2015-06-22: qty 15

## 2015-06-22 NOTE — Clinical Social Work Note (Signed)
Clinical Social Work Assessment  Patient Details  Name: Alexis Thompson MRN: [id number] Date of Birth: [birth year]  Date of referral:  07/13/2015               Reason for consult:  Emotional/Coping/Adjustment to Illness, Discharge Planning, End of Life/Hospice, Facility Placement                Permission sought to share information with:  Facility Sport and exercise psychologist Permission granted to share information::  No (Permission given by daughter Eugenie Filler    [phone])  Name::      ([facility] )  Agency::   [facility] )  Relationship::  daughter   Contact Information:  Eugenie Filler    [phone]  Housing/Transportation Living arrangements for the past 2 months:  [place] of Information:  Adult Children Patient Interpreter Needed:  None Criminal Activity/Legal Involvement Pertinent to Current Situation/Hospitalization:  No - Comment as needed Significant Relationships:  Adult Children, Spouse Lives with:  Facility Resident Do you feel safe going back to the place where you live?  No Need for family participation in patient care:  Yes (Comment) (Pt unable to make own decisions  daughters and spouse are able to make decisions. )  Care giving concerns:  Pt's daughter stated that she would "just want her mother to be comfortable." Family has been preparing for this time over the last several months.    Social Worker assessment / plan:  CSW introduced self and met with the family outside the room. CSW explained the residential hospice referral and the understanding that the Pt would need to go to a residential hospice facility. Pt's daughter Jackelyn Poling was aware of the referral and had spoken with the admission coordinator. Family and coordinator have a meeting today at [place]. After that meeting the Pt should be clear to go to [facility]. CSW requested daughter give a brief overview of patients recent admissions and current  admission. Pt's daughter stated that Pt was originally living in [facility] for the last year and 1/2. The Pts daughter stated that the Pt began falling and there was no apparent cause at that time. Pt's daughter explained that Pt has been inpt at both [facility] and [facility] prior to coming to [facility]. Pt was discharged to [facility] place and was only there for rehab at first, that later became long term care. Pt became combatant at [facility] and began to decline further. Pt's responses have decreased according to Pt's daughter and they are prepared for Pt to transfer to a [place]. CSW was given permission to assist with transfer and communication with facility. CSW will continue to work with family and facility for dc planning.     Employment status:  Disabled (Comment on whether or not currently receiving Disability) Insurance information:  Managed Medicare (Will swith to regular Medicare once admitted into facility. ) PT Recommendations:  Not assessed at this time Information / Referral to community resources:  Other (Comment Required) (Residential Hospice)  Patient/Family's Response to care:  Family is appreciative to the Unit staff and doctors for great care and concern for the Pt. Pt's family was also appreciative for CSW assistance in contacting facility and assisting with coordination of admission meeting with facility.   Patient/Family's Understanding of and Emotional Response to Diagnosis, Current Treatment, and Prognosis:  Pt's family is fully aware of Pts decline and has been preparing for this  life transition for the last several months. CSW provided information for individual counseling through the [place] for both adults and any children and teenagers involved.   Emotional Assessment Appearance:  Appears older than stated age Attitude/Demeanor/Rapport:    Affect (typically observed):  Unable to Assess Orientation:   (Not oriented at  this time. ) Alcohol / Substance use:  Not Applicable Psych involvement (Current and /or in the community):  No (Comment)  Discharge Needs  Concerns to be addressed:  Grief and Loss Concerns, Discharge Planning Concerns, Coping/Stress Concerns Readmission within the last 30 days:  Yes Current discharge risk:  Terminally ill Barriers to Discharge:  No Barriers Identified   Pete Pelt 06/14/2015, 3:45 PM

## 2015-06-22 NOTE — Progress Notes (Signed)
While CSW was preparing for Pt to transfer to [facility], the Pt passed away. Family and nurse were at the bedside as well as admission coordinator.   CSW offered services if needed.   None needed at this time.    CSW signing off.    Leron CroakCassandra Jacilyn Sanpedro [facility] AddisonCSWA  [facility] [phone]

## 2015-06-22 NOTE — Progress Notes (Signed)
CSW contacted [facility] for the Residential Hospice referral (based on comment in referral).  CSW spoke with Seward GraterMaggie at facility and representative stated there is rooms available.   Facility has a representative in the area and will be able to meet with the family shortly.   CSW will meet with the family to share information and complete assessment.   CSW will assist with Pt d/c planning to residential hospice today.    Leron Croakassandra Helios Kohlmann LCSWA  [facility] [phone]
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
## 2015-06-22 NOTE — Progress Notes (Signed)
Daily Progress Note   Patient Name: Alexis Thompson       Date: 07/04/2015 DOB: 1940/11/30  Age: 75 y.o. MRN#: [id number] Attending Physician: Richarda Overlie, MD Primary Care Physician: Oneal Grout, MD Admit Date: 06/12/2015  Reason for Consultation/Follow-up: Non pain symptom management, Pain control and Terminal Care  Subjective: Pt transitioning. She is unresponsive even with mouth care. Increased work of breathing. No mottling presently Interval Events: Clinical decline Length of Stay: 10 days  Current Medications: Scheduled Meds:  . diazepam  5 mg Intravenous 3 times per day  .  HYDROmorphone (DILAUDID) injection  1 mg Intravenous 6 times per day  .  HYDROmorphone (DILAUDID) injection  1 mg Intravenous NOW  . polyvinyl alcohol  2 drop Both Eyes 3 times per day  . sodium chloride  3 mL Intravenous Q12H    Continuous Infusions:    PRN Meds: diazepam, glycopyrrolate, haloperidol lactate, HYDROmorphone (DILAUDID) injection, ondansetron (ZOFRAN) IV  Physical Exam: Physical Exam  Constitutional:  Acutely ill female; appears to be transitioning  Cardiovascular:  tachy  Pulmonary/Chest:  Increased work of breathing  Genitourinary:  foley  Neurological:  unresponsive  Skin: Skin is warm and dry.  No mottling presently  Psychiatric:  unresponsive                Vital Signs: BP 133/75 mmHg  Pulse 106  Temp(Src) 98 F (36.7 C) (Axillary)  Resp 21  Ht 5' (1.524 m)  Wt 86.1 kg (189 lb 13.1 oz)  BMI 37.07 kg/m2  SpO2 90% SpO2: SpO2: 90 % O2 Device: O2 Device: Not Delivered O2 Flow Rate:    Intake/output summary:  Intake/Output Summary (Last 24 hours) at 07/08/2015 1040 Last data filed at 06/21/15 1843  Gross per 24 hour  Intake      0 ml  Output    600 ml  Net    -600 ml   LBM: Last BM Date: 06/19/15 Baseline Weight: Weight: 86.1 kg (189 lb 13.1 oz) Most recent weight: Weight: 86.1 kg (189 lb 13.1 oz)       Palliative Assessment/Data: Flowsheet Rows        Most Recent Value   Intake Tab    Referral Department  Hospitalist   Unit at Time of Referral  Cardiac/Telemetry Unit   Palliative Care Primary Diagnosis  Other (Comment)   Date Notified  06/19/15   Palliative Care Type  New Palliative care   Reason for referral  Non-pain Symptom   Date of Admission  06/12/15   Date first seen by Palliative Care  06/19/15   # of days Palliative referral response time  0 Day(s)   # of days IP prior to Palliative referral  7   Clinical Assessment    Palliative Performance Scale Score  40%   Anxiety Max Last 24 Hours  10   Anxiety Min Last 24 Hours  4   Psychosocial & Spiritual Assessment    Palliative Care Outcomes    Patient/Family meeting held?  No      Additional Data Reviewed: CBC    Component Value Date/Time   WBC 4.9 06/19/2015 0535   RBC 4.09 06/19/2015 0535   HGB 12.4 06/19/2015 0535   HCT 39.3 06/19/2015 0535   PLT 159 06/19/2015 0535   MCV 96.1 06/19/2015 0535   MCH 30.3 06/19/2015 0535   MCHC 31.6 06/19/2015 0535   RDW 16.4* 06/19/2015 0535   LYMPHSABS 2.1 06/12/2015 1153   MONOABS 0.7 06/12/2015 1153   EOSABS 0.2 06/12/2015 1153   BASOSABS 0.1 06/12/2015 1153    CMP     Component Value Date/Time   NA 151* 06/19/2015 0535   K 3.4* 06/19/2015 0535   CL 113* 06/19/2015 0535   CO2 21* 06/19/2015 0535   GLUCOSE 119* 06/19/2015 0535   BUN 16 06/19/2015 0535   CREATININE 1.17* 06/19/2015 0535   CALCIUM 10.0 06/19/2015 0535   PROT 6.8 06/19/2015 0535   ALBUMIN 3.6 06/19/2015 0535   AST 20 06/19/2015 0535   ALT 18 06/19/2015 0535   ALKPHOS 58 06/19/2015 0535   BILITOT 0.9 06/19/2015 0535   GFRNONAA 45* 06/19/2015 0535   GFRAA 53* 06/19/2015 0535       Problem List:  Patient Active Problem List   Diagnosis Date  Noted  . Palliative care encounter   . Acute encephalopathy   . Altered mental state   . Myoclonus   . Acathisia 06/12/2015  . AKI (acute kidney injury) (HCC)   . Confusion   . Cellulitis, neck 05/27/2015  . Sepsis (HCC) 05/27/2015  . Tremor 05/27/2015  . Allergic reaction   . Bipolar I disorder (HCC) 04/27/2015  . Drug induced akathisia 04/27/2015  . Essential hypertension 04/27/2015  . Anxiety 04/27/2015  . Hyperlipidemia 04/27/2015  . Neuropathy (HCC) 04/27/2015  . Insomnia 04/27/2015  . GERD (gastroesophageal reflux disease) 04/27/2015  . Overactive bladder 04/27/2015  . Right hand/wrist pain 04/27/2015     Palliative Care Assessment & Plan    1.Code Status:  DNR    Code Status Orders        Start     Ordered   06/19/15 1706  Do not attempt resuscitation (DNR)   Continuous    Question Answer Comment  In the event of cardiac or respiratory ARREST Do not call a "code blue"   In the event of cardiac or respiratory ARREST Do not perform Intubation, CPR, defibrillation or ACLS   In the event of cardiac or respiratory ARREST Use medication by any route, position, wound care, and other measures to relive pain and suffering. May use oxygen, suction and manual treatment of airway obstruction as needed for comfort.      06/19/15 1705       2. Goals of Care/Additional Recommendations:  Waiting for SW and bed availability for transfer to  in-pt hospice. Depending on when this happens she maybe to sick to transport  Limitations on Scope of Treatment: Full Comfort Care  Desire for further Chaplaincy support:no  Psycho-social Needs: Grief/Bereavement Support  3. Symptom Management:      1.Pain: Improving with scheduled dilaudid but will up titrate dose to address tachypnea this am      2. Dyspnea: Needs improvement. Will increase dilaudid to 1mg  q4 atc and q2 prn. Give dilaudid 1mg  now      3. Agitation/abnornal movements: Improving but will up titrate prn dosing to  5-10 mg q4 prn. Cont with valium 5mg  q8 ATC  4. Palliative Prophylaxis:   Bowel Regimen, Delirium Protocol, Frequent Pain Assessment and Turn Reposition  5. Prognosis: Hours - Days  6. Discharge Planning:  Hospice facility    Thank you for allowing the Palliative Medicine Team to assist in the care of this patient.   Time In: 1020 Time Out: 1048 Total Time 28 min Prolonged Time Billed  no         Irean HongSarah Grace Latysha Thackston, NP  06/14/2015, 10:40 AM  Please contact Palliative Medicine Team phone at [phone] for questions and concerns.

## 2015-06-22 NOTE — Progress Notes (Signed)
Nutrition Brief Note  Chart reviewed. Pt now transitioning to comfort care.  No further nutrition interventions warranted at this time.  Please re-consult as needed.   Pristine Gladhill A. Taisia Fantini, RD, LDN, CDE Pager: [phone] After hours Pager: [phone]  

## 2015-06-22 NOTE — Care Management Important Message (Signed)
Important Message  Patient Details  Name: Alexis Thompson MRN: [id number] Date of Birth: 05-03-1941   Medicare Important Message Given:  Yes    Kingsley PlanWile, Verneice Caspers Marie, RN 06/30/2015, 2:28 PM

## 2015-06-22 NOTE — Consult Note (Signed)
[facility]-  Thank you for the kind referral of this pt.  Was able to meet with dtr Anthony Sarebbie Phillips who [facility] and as arrangements were being made for tranfer, pt passed away.  [facility].  Gave her contact info for bereavement services as needed during the grief process.   Julius BowelsJoAnn N. Scott, RN

## 2015-07-15 DEATH — deceased

## 2016-06-13 DEATH — deceased

## 2017-10-08 IMAGING — CT CT HEAD W/O CM
3 of 4 series · 17 of 30 positions shown, 19 images · non-contrast
Comparison: Neck CT without contrast 4031 hours today. Head CT
05/12/2015.

CLINICAL DATA: 73-year-old female with profound confusion,
agitation. Initial encounter.

EXAM:
CT HEAD WITHOUT CONTRAST
TECHNIQUE: Contiguous axial images were obtained from the base of the skull
through the vertex without intravenous contrast.

[Series 2: bone windows · axial · 0.45mm/px · z∈[-214,-94]mm · 8 of 48 slices shown (1 of 2)]
[im 4/48  bone]
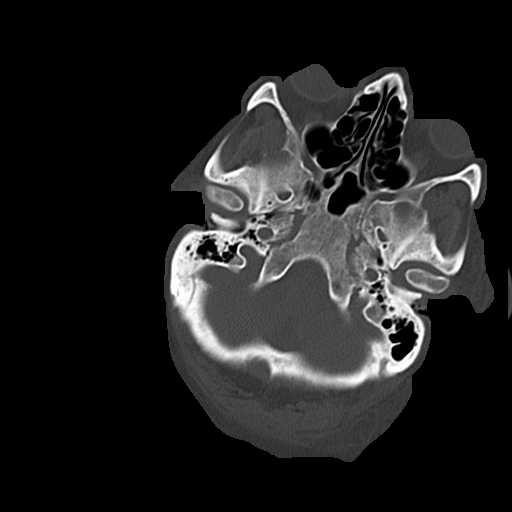
[im 11/48  bone]
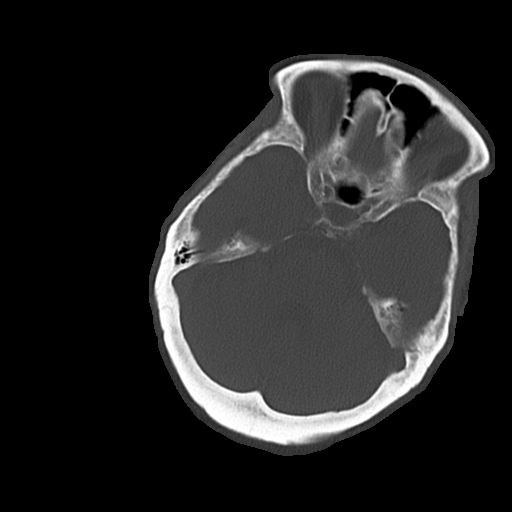
[im 15/48  bone]
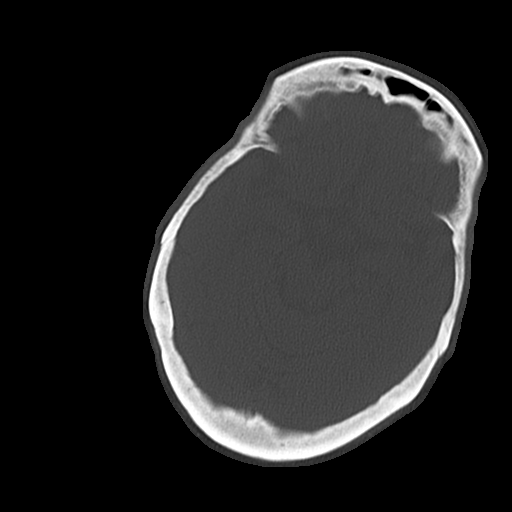
[im 22/48  bone]
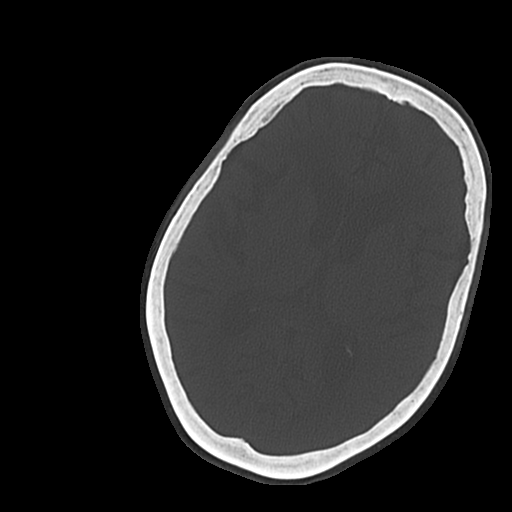
[im 26/48  bone]
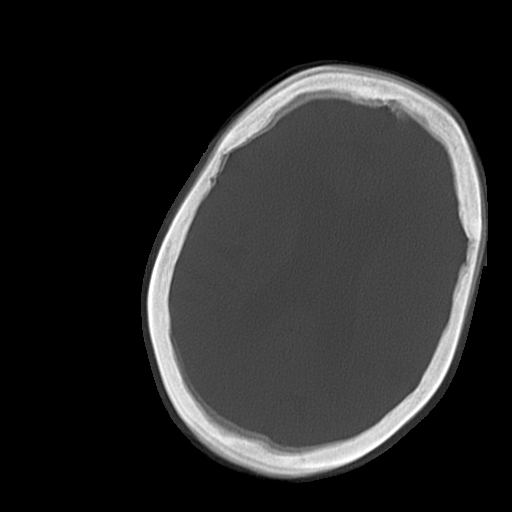
[im 33/48  bone]
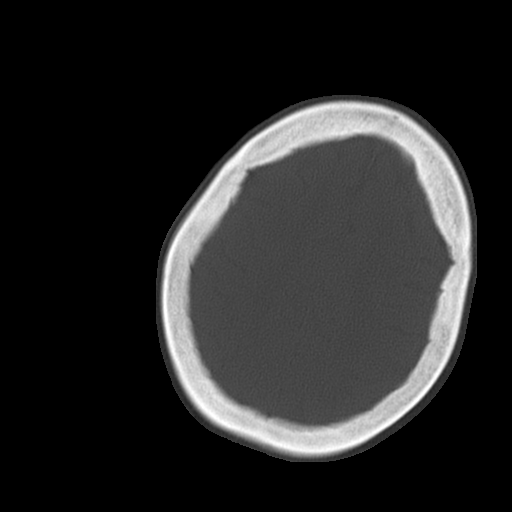
[im 37/48  bone]
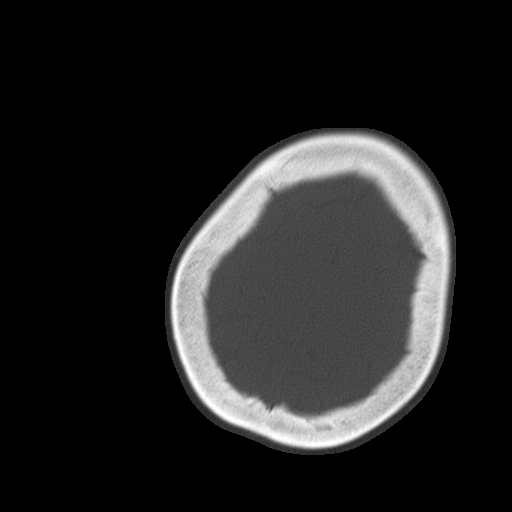
[im 44/48  bone]
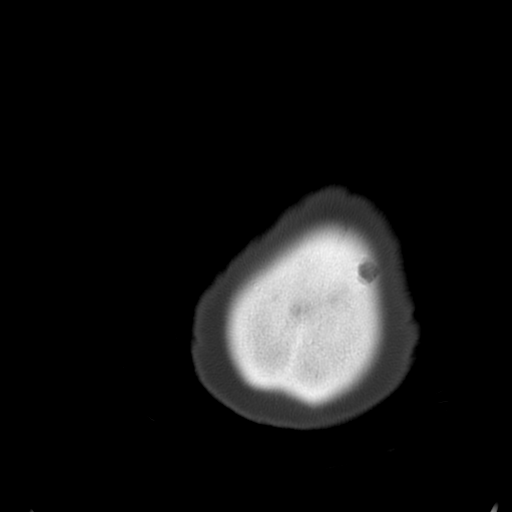

[Series 3: head w/o · axial · non-contrast · 0.45mm/px · z∈[-208,-103]mm · 7 of 29 slices shown, 9 images]
[im 4/29  brain]
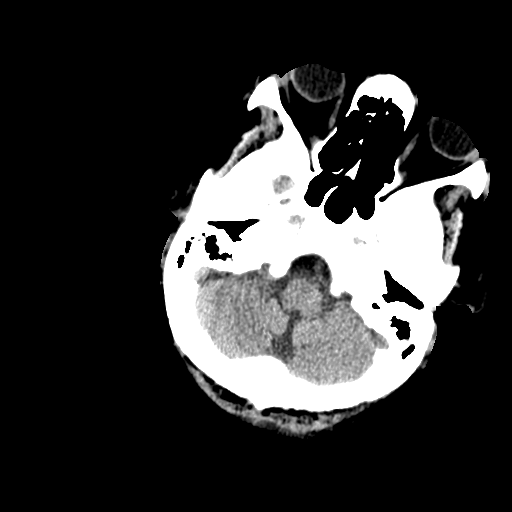
[im 4/29  bone]
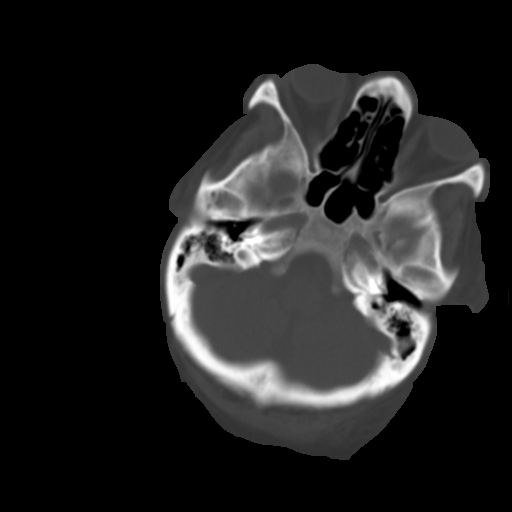
[im 8/29  brain]
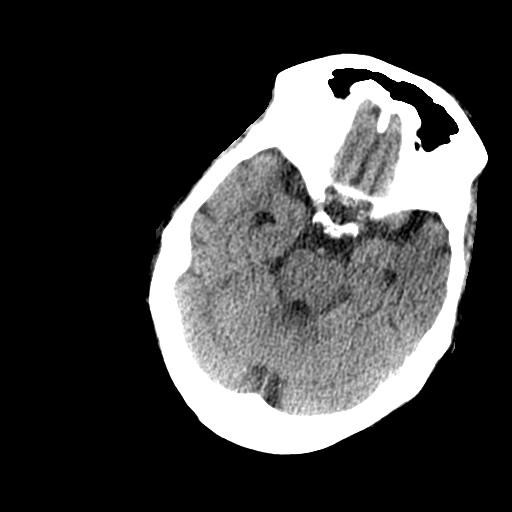
[im 11/29  brain]
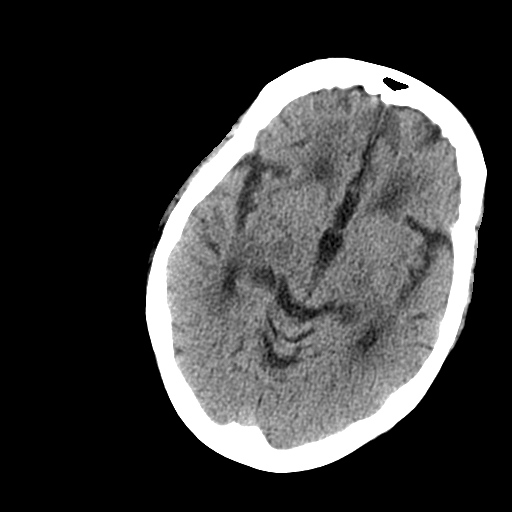
[im 15/29  brain]
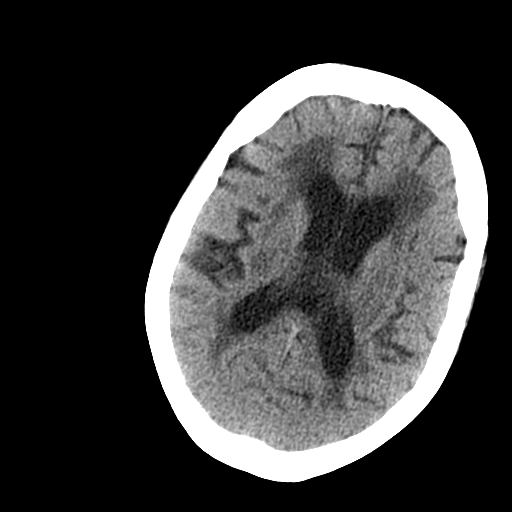
[im 18/29  brain]
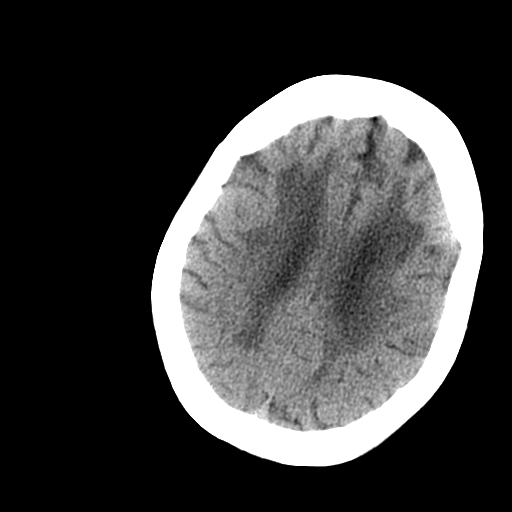
[im 18/29  bone]
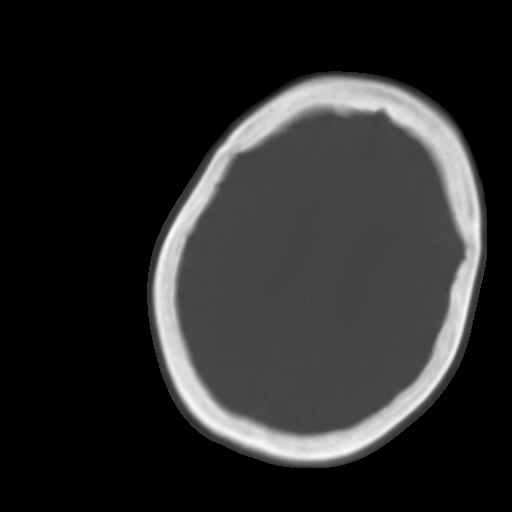
[im 22/29  brain]
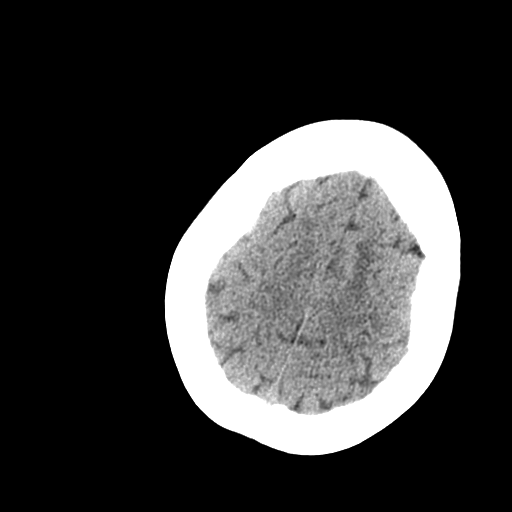
[im 25/29  brain]
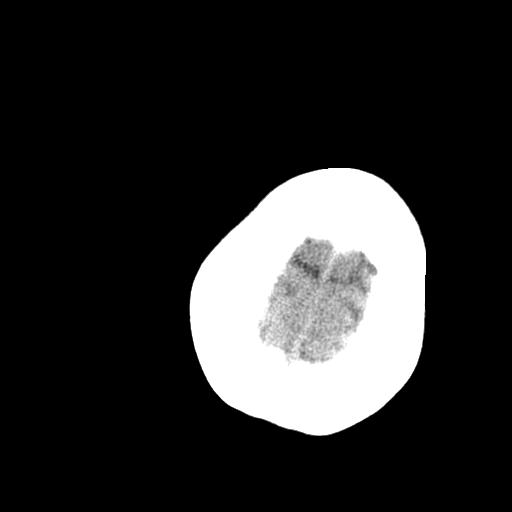

[Series 5: bone windows · axial · 0.45mm/px · z∈[-152,-137]mm · 2 of 15 slices shown (2 of 2)]
[im 5/15  bone]
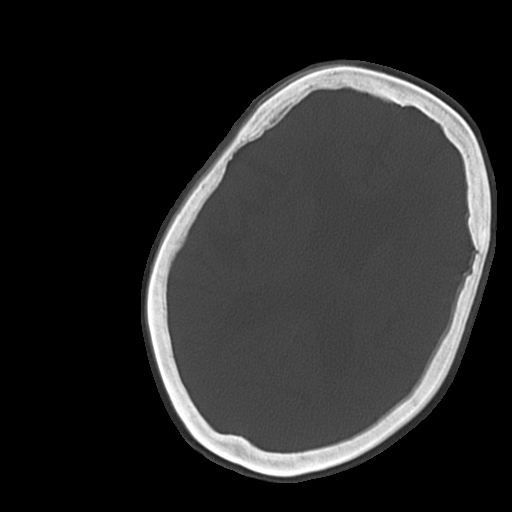
[im 10/15  bone]
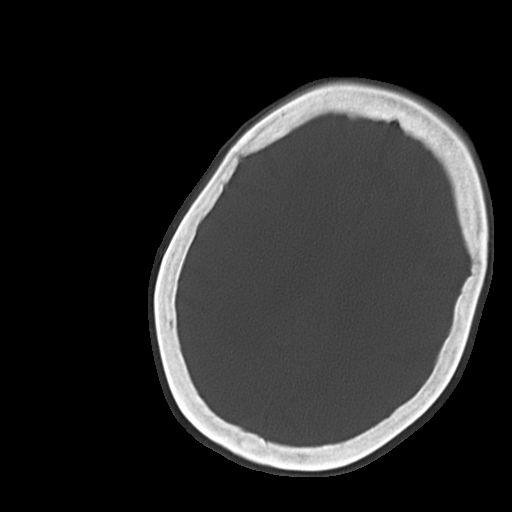

[17 of 30 positions shown; findings below may reference images not displayed]

FINDINGS: Visualized paranasal sinuses and mastoids are clear. No acute
osseous abnormality identified. Mild face and scalp edema about the
base of the skull. Orbits soft tissues remain normal.

Calcified atherosclerosis at the skull base.+++ confluent bilateral
cerebral white matter hypodensity appears stable. No midline shift,
mass effect, or evidence of intracranial mass lesion. No
ventriculomegaly. No acute intracranial hemorrhage identified. No
suspicious intracranial vascular hyperdensity.
IMPRESSION: No acute intracranial abnormality.

Stable noncontrast CT appearance of the brain with advanced
nonspecific white matter changes.

## 2018-01-24 ENCOUNTER — Encounter: Payer: Self-pay | Admitting: Internal Medicine
